# Patient Record
Sex: Male | Born: 1955 | Race: White | Hispanic: No | Marital: Married | Smoking: Former smoker
Health system: Southern US, Community
[De-identification: ages and names within clinical notes are randomized; demographics above are authoritative.]

## PROBLEM LIST (undated history)

## (undated) DIAGNOSIS — I10 Essential (primary) hypertension: Secondary | ICD-10-CM

## (undated) DIAGNOSIS — F32A Depression, unspecified: Secondary | ICD-10-CM

## (undated) DIAGNOSIS — I471 Supraventricular tachycardia, unspecified: Secondary | ICD-10-CM

## (undated) DIAGNOSIS — E119 Type 2 diabetes mellitus without complications: Secondary | ICD-10-CM

## (undated) HISTORY — DX: Supraventricular tachycardia: I47.1

## (undated) HISTORY — DX: Supraventricular tachycardia, unspecified: I47.10

---

## 2019-08-29 DIAGNOSIS — F329 Major depressive disorder, single episode, unspecified: Secondary | ICD-10-CM | POA: Diagnosis not present

## 2019-08-29 DIAGNOSIS — E785 Hyperlipidemia, unspecified: Secondary | ICD-10-CM | POA: Diagnosis not present

## 2019-08-29 DIAGNOSIS — I10 Essential (primary) hypertension: Secondary | ICD-10-CM | POA: Diagnosis not present

## 2019-08-29 DIAGNOSIS — E1165 Type 2 diabetes mellitus with hyperglycemia: Secondary | ICD-10-CM | POA: Diagnosis not present

## 2019-08-29 DIAGNOSIS — Z23 Encounter for immunization: Secondary | ICD-10-CM | POA: Diagnosis not present

## 2019-10-05 DIAGNOSIS — Z6828 Body mass index (BMI) 28.0-28.9, adult: Secondary | ICD-10-CM | POA: Diagnosis not present

## 2019-10-05 DIAGNOSIS — N390 Urinary tract infection, site not specified: Secondary | ICD-10-CM | POA: Diagnosis not present

## 2019-12-26 DIAGNOSIS — I10 Essential (primary) hypertension: Secondary | ICD-10-CM | POA: Diagnosis not present

## 2019-12-26 DIAGNOSIS — R350 Frequency of micturition: Secondary | ICD-10-CM | POA: Diagnosis not present

## 2019-12-26 DIAGNOSIS — E1165 Type 2 diabetes mellitus with hyperglycemia: Secondary | ICD-10-CM | POA: Diagnosis not present

## 2019-12-26 DIAGNOSIS — E785 Hyperlipidemia, unspecified: Secondary | ICD-10-CM | POA: Diagnosis not present

## 2020-01-13 DIAGNOSIS — R350 Frequency of micturition: Secondary | ICD-10-CM | POA: Diagnosis not present

## 2020-02-06 DIAGNOSIS — Z87440 Personal history of urinary (tract) infections: Secondary | ICD-10-CM | POA: Diagnosis not present

## 2020-02-06 DIAGNOSIS — R338 Other retention of urine: Secondary | ICD-10-CM | POA: Diagnosis not present

## 2020-02-06 DIAGNOSIS — R339 Retention of urine, unspecified: Secondary | ICD-10-CM | POA: Diagnosis not present

## 2020-02-06 DIAGNOSIS — N529 Male erectile dysfunction, unspecified: Secondary | ICD-10-CM | POA: Diagnosis not present

## 2020-02-06 DIAGNOSIS — Z87448 Personal history of other diseases of urinary system: Secondary | ICD-10-CM | POA: Diagnosis not present

## 2020-02-06 DIAGNOSIS — N401 Enlarged prostate with lower urinary tract symptoms: Secondary | ICD-10-CM | POA: Diagnosis not present

## 2020-02-06 DIAGNOSIS — N35919 Unspecified urethral stricture, male, unspecified site: Secondary | ICD-10-CM | POA: Diagnosis not present

## 2020-02-06 DIAGNOSIS — N35812 Other urethral bulbous stricture, male: Secondary | ICD-10-CM | POA: Diagnosis not present

## 2020-02-06 DIAGNOSIS — R82998 Other abnormal findings in urine: Secondary | ICD-10-CM | POA: Diagnosis not present

## 2020-02-10 DIAGNOSIS — N35914 Unspecified anterior urethral stricture, male: Secondary | ICD-10-CM | POA: Diagnosis not present

## 2020-02-10 DIAGNOSIS — N390 Urinary tract infection, site not specified: Secondary | ICD-10-CM | POA: Diagnosis not present

## 2020-02-10 DIAGNOSIS — E119 Type 2 diabetes mellitus without complications: Secondary | ICD-10-CM | POA: Diagnosis not present

## 2020-02-21 DIAGNOSIS — E119 Type 2 diabetes mellitus without complications: Secondary | ICD-10-CM | POA: Diagnosis not present

## 2020-02-21 DIAGNOSIS — Z7189 Other specified counseling: Secondary | ICD-10-CM | POA: Diagnosis not present

## 2020-02-21 DIAGNOSIS — Z03818 Encounter for observation for suspected exposure to other biological agents ruled out: Secondary | ICD-10-CM | POA: Diagnosis not present

## 2020-02-21 DIAGNOSIS — Z20828 Contact with and (suspected) exposure to other viral communicable diseases: Secondary | ICD-10-CM | POA: Diagnosis not present

## 2020-03-01 DIAGNOSIS — Z03818 Encounter for observation for suspected exposure to other biological agents ruled out: Secondary | ICD-10-CM | POA: Diagnosis not present

## 2020-03-05 DIAGNOSIS — N35919 Unspecified urethral stricture, male, unspecified site: Secondary | ICD-10-CM | POA: Diagnosis not present

## 2020-03-05 DIAGNOSIS — N401 Enlarged prostate with lower urinary tract symptoms: Secondary | ICD-10-CM | POA: Diagnosis not present

## 2020-03-05 DIAGNOSIS — E119 Type 2 diabetes mellitus without complications: Secondary | ICD-10-CM | POA: Diagnosis not present

## 2020-03-05 DIAGNOSIS — N35914 Unspecified anterior urethral stricture, male: Secondary | ICD-10-CM | POA: Diagnosis not present

## 2020-03-05 DIAGNOSIS — J45909 Unspecified asthma, uncomplicated: Secondary | ICD-10-CM | POA: Diagnosis not present

## 2020-03-05 DIAGNOSIS — Z87440 Personal history of urinary (tract) infections: Secondary | ICD-10-CM | POA: Diagnosis not present

## 2020-03-05 DIAGNOSIS — Z794 Long term (current) use of insulin: Secondary | ICD-10-CM | POA: Diagnosis not present

## 2020-03-05 DIAGNOSIS — F329 Major depressive disorder, single episode, unspecified: Secondary | ICD-10-CM | POA: Diagnosis not present

## 2020-03-05 DIAGNOSIS — I10 Essential (primary) hypertension: Secondary | ICD-10-CM | POA: Diagnosis not present

## 2020-03-05 DIAGNOSIS — R338 Other retention of urine: Secondary | ICD-10-CM | POA: Diagnosis not present

## 2020-03-05 DIAGNOSIS — E785 Hyperlipidemia, unspecified: Secondary | ICD-10-CM | POA: Diagnosis not present

## 2020-03-05 DIAGNOSIS — N35912 Unspecified bulbous urethral stricture, male: Secondary | ICD-10-CM | POA: Diagnosis not present

## 2020-03-15 DIAGNOSIS — I10 Essential (primary) hypertension: Secondary | ICD-10-CM | POA: Diagnosis not present

## 2020-03-15 DIAGNOSIS — Z6829 Body mass index (BMI) 29.0-29.9, adult: Secondary | ICD-10-CM | POA: Diagnosis not present

## 2020-03-15 DIAGNOSIS — E1165 Type 2 diabetes mellitus with hyperglycemia: Secondary | ICD-10-CM | POA: Diagnosis not present

## 2020-03-15 DIAGNOSIS — Z1331 Encounter for screening for depression: Secondary | ICD-10-CM | POA: Diagnosis not present

## 2020-04-12 DIAGNOSIS — N35914 Unspecified anterior urethral stricture, male: Secondary | ICD-10-CM | POA: Diagnosis not present

## 2020-04-12 DIAGNOSIS — Z466 Encounter for fitting and adjustment of urinary device: Secondary | ICD-10-CM | POA: Diagnosis not present

## 2020-04-17 DIAGNOSIS — E1165 Type 2 diabetes mellitus with hyperglycemia: Secondary | ICD-10-CM | POA: Diagnosis not present

## 2020-04-17 DIAGNOSIS — E7800 Pure hypercholesterolemia, unspecified: Secondary | ICD-10-CM | POA: Diagnosis not present

## 2020-04-17 DIAGNOSIS — I10 Essential (primary) hypertension: Secondary | ICD-10-CM | POA: Diagnosis not present

## 2020-04-17 DIAGNOSIS — N35919 Unspecified urethral stricture, male, unspecified site: Secondary | ICD-10-CM | POA: Diagnosis not present

## 2020-05-09 DIAGNOSIS — N35914 Unspecified anterior urethral stricture, male: Secondary | ICD-10-CM | POA: Diagnosis not present

## 2020-05-09 DIAGNOSIS — Z466 Encounter for fitting and adjustment of urinary device: Secondary | ICD-10-CM | POA: Diagnosis not present

## 2020-06-07 DIAGNOSIS — Z87440 Personal history of urinary (tract) infections: Secondary | ICD-10-CM | POA: Diagnosis not present

## 2020-06-07 DIAGNOSIS — N528 Other male erectile dysfunction: Secondary | ICD-10-CM | POA: Diagnosis not present

## 2020-06-07 DIAGNOSIS — E0821 Diabetes mellitus due to underlying condition with diabetic nephropathy: Secondary | ICD-10-CM | POA: Diagnosis not present

## 2020-06-07 DIAGNOSIS — N35914 Unspecified anterior urethral stricture, male: Secondary | ICD-10-CM | POA: Diagnosis not present

## 2020-06-20 DIAGNOSIS — I10 Essential (primary) hypertension: Secondary | ICD-10-CM | POA: Diagnosis not present

## 2020-06-20 DIAGNOSIS — F329 Major depressive disorder, single episode, unspecified: Secondary | ICD-10-CM | POA: Diagnosis not present

## 2020-06-20 DIAGNOSIS — E7800 Pure hypercholesterolemia, unspecified: Secondary | ICD-10-CM | POA: Diagnosis not present

## 2020-06-20 DIAGNOSIS — E1165 Type 2 diabetes mellitus with hyperglycemia: Secondary | ICD-10-CM | POA: Diagnosis not present

## 2020-07-11 DIAGNOSIS — N35914 Unspecified anterior urethral stricture, male: Secondary | ICD-10-CM | POA: Diagnosis not present

## 2020-07-18 DIAGNOSIS — Z20822 Contact with and (suspected) exposure to covid-19: Secondary | ICD-10-CM | POA: Diagnosis not present

## 2020-08-06 DIAGNOSIS — Z466 Encounter for fitting and adjustment of urinary device: Secondary | ICD-10-CM | POA: Diagnosis not present

## 2020-08-06 DIAGNOSIS — N35914 Unspecified anterior urethral stricture, male: Secondary | ICD-10-CM | POA: Diagnosis not present

## 2020-08-17 DIAGNOSIS — E1165 Type 2 diabetes mellitus with hyperglycemia: Secondary | ICD-10-CM | POA: Diagnosis not present

## 2020-08-17 DIAGNOSIS — Z20822 Contact with and (suspected) exposure to covid-19: Secondary | ICD-10-CM | POA: Diagnosis not present

## 2020-08-21 DIAGNOSIS — E1165 Type 2 diabetes mellitus with hyperglycemia: Secondary | ICD-10-CM | POA: Diagnosis not present

## 2020-08-21 DIAGNOSIS — N35919 Unspecified urethral stricture, male, unspecified site: Secondary | ICD-10-CM | POA: Diagnosis not present

## 2020-08-21 DIAGNOSIS — F329 Major depressive disorder, single episode, unspecified: Secondary | ICD-10-CM | POA: Diagnosis not present

## 2020-08-21 DIAGNOSIS — Z23 Encounter for immunization: Secondary | ICD-10-CM | POA: Diagnosis not present

## 2020-08-21 DIAGNOSIS — I10 Essential (primary) hypertension: Secondary | ICD-10-CM | POA: Diagnosis not present

## 2020-09-25 DIAGNOSIS — N35914 Unspecified anterior urethral stricture, male: Secondary | ICD-10-CM | POA: Diagnosis not present

## 2020-09-25 DIAGNOSIS — Z466 Encounter for fitting and adjustment of urinary device: Secondary | ICD-10-CM | POA: Diagnosis not present

## 2020-10-04 DIAGNOSIS — N35914 Unspecified anterior urethral stricture, male: Secondary | ICD-10-CM | POA: Diagnosis not present

## 2020-10-04 DIAGNOSIS — N521 Erectile dysfunction due to diseases classified elsewhere: Secondary | ICD-10-CM | POA: Diagnosis not present

## 2020-10-04 DIAGNOSIS — E0821 Diabetes mellitus due to underlying condition with diabetic nephropathy: Secondary | ICD-10-CM | POA: Diagnosis not present

## 2020-10-04 DIAGNOSIS — Z87440 Personal history of urinary (tract) infections: Secondary | ICD-10-CM | POA: Diagnosis not present

## 2020-10-22 DIAGNOSIS — Z466 Encounter for fitting and adjustment of urinary device: Secondary | ICD-10-CM | POA: Diagnosis not present

## 2020-10-23 DIAGNOSIS — I10 Essential (primary) hypertension: Secondary | ICD-10-CM | POA: Diagnosis not present

## 2020-10-23 DIAGNOSIS — Z6830 Body mass index (BMI) 30.0-30.9, adult: Secondary | ICD-10-CM | POA: Diagnosis not present

## 2020-10-23 DIAGNOSIS — E1165 Type 2 diabetes mellitus with hyperglycemia: Secondary | ICD-10-CM | POA: Diagnosis not present

## 2020-10-23 DIAGNOSIS — E7800 Pure hypercholesterolemia, unspecified: Secondary | ICD-10-CM | POA: Diagnosis not present

## 2020-11-28 DIAGNOSIS — Z87440 Personal history of urinary (tract) infections: Secondary | ICD-10-CM | POA: Diagnosis not present

## 2020-11-28 DIAGNOSIS — R82998 Other abnormal findings in urine: Secondary | ICD-10-CM | POA: Diagnosis not present

## 2020-11-28 DIAGNOSIS — N35914 Unspecified anterior urethral stricture, male: Secondary | ICD-10-CM | POA: Diagnosis not present

## 2020-11-28 DIAGNOSIS — Z466 Encounter for fitting and adjustment of urinary device: Secondary | ICD-10-CM | POA: Diagnosis not present

## 2020-11-28 DIAGNOSIS — B961 Klebsiella pneumoniae [K. pneumoniae] as the cause of diseases classified elsewhere: Secondary | ICD-10-CM | POA: Diagnosis not present

## 2020-12-24 DIAGNOSIS — N35919 Unspecified urethral stricture, male, unspecified site: Secondary | ICD-10-CM | POA: Diagnosis not present

## 2020-12-24 DIAGNOSIS — I10 Essential (primary) hypertension: Secondary | ICD-10-CM | POA: Diagnosis not present

## 2020-12-24 DIAGNOSIS — E7800 Pure hypercholesterolemia, unspecified: Secondary | ICD-10-CM | POA: Diagnosis not present

## 2020-12-24 DIAGNOSIS — E1165 Type 2 diabetes mellitus with hyperglycemia: Secondary | ICD-10-CM | POA: Diagnosis not present

## 2022-01-09 DIAGNOSIS — N35914 Unspecified anterior urethral stricture, male: Secondary | ICD-10-CM | POA: Diagnosis not present

## 2022-01-09 DIAGNOSIS — N529 Male erectile dysfunction, unspecified: Secondary | ICD-10-CM | POA: Diagnosis not present

## 2022-01-09 DIAGNOSIS — N521 Erectile dysfunction due to diseases classified elsewhere: Secondary | ICD-10-CM | POA: Diagnosis not present

## 2022-02-06 DIAGNOSIS — E119 Type 2 diabetes mellitus without complications: Secondary | ICD-10-CM | POA: Diagnosis not present

## 2022-02-06 DIAGNOSIS — I10 Essential (primary) hypertension: Secondary | ICD-10-CM | POA: Diagnosis not present

## 2022-02-06 DIAGNOSIS — E7800 Pure hypercholesterolemia, unspecified: Secondary | ICD-10-CM | POA: Diagnosis not present

## 2022-02-06 DIAGNOSIS — E538 Deficiency of other specified B group vitamins: Secondary | ICD-10-CM | POA: Diagnosis not present

## 2022-02-06 DIAGNOSIS — R413 Other amnesia: Secondary | ICD-10-CM | POA: Diagnosis not present

## 2022-02-06 DIAGNOSIS — Z0100 Encounter for examination of eyes and vision without abnormal findings: Secondary | ICD-10-CM | POA: Diagnosis not present

## 2022-02-06 DIAGNOSIS — Z6831 Body mass index (BMI) 31.0-31.9, adult: Secondary | ICD-10-CM | POA: Diagnosis not present

## 2022-02-06 DIAGNOSIS — R5383 Other fatigue: Secondary | ICD-10-CM | POA: Diagnosis not present

## 2022-02-17 DIAGNOSIS — N528 Other male erectile dysfunction: Secondary | ICD-10-CM | POA: Diagnosis not present

## 2022-04-01 DIAGNOSIS — Z794 Long term (current) use of insulin: Secondary | ICD-10-CM | POA: Diagnosis not present

## 2022-04-01 DIAGNOSIS — Z7984 Long term (current) use of oral hypoglycemic drugs: Secondary | ICD-10-CM | POA: Diagnosis not present

## 2022-04-01 DIAGNOSIS — R0602 Shortness of breath: Secondary | ICD-10-CM | POA: Diagnosis not present

## 2022-04-01 DIAGNOSIS — Z7982 Long term (current) use of aspirin: Secondary | ICD-10-CM | POA: Diagnosis not present

## 2022-04-01 DIAGNOSIS — I10 Essential (primary) hypertension: Secondary | ICD-10-CM | POA: Diagnosis not present

## 2022-04-01 DIAGNOSIS — E119 Type 2 diabetes mellitus without complications: Secondary | ICD-10-CM | POA: Diagnosis not present

## 2022-04-01 DIAGNOSIS — R079 Chest pain, unspecified: Secondary | ICD-10-CM | POA: Diagnosis not present

## 2022-04-01 DIAGNOSIS — E785 Hyperlipidemia, unspecified: Secondary | ICD-10-CM | POA: Diagnosis not present

## 2022-04-01 DIAGNOSIS — I208 Other forms of angina pectoris: Secondary | ICD-10-CM | POA: Diagnosis not present

## 2022-04-01 DIAGNOSIS — F32A Depression, unspecified: Secondary | ICD-10-CM | POA: Diagnosis not present

## 2022-04-01 DIAGNOSIS — R69 Illness, unspecified: Secondary | ICD-10-CM | POA: Diagnosis not present

## 2022-04-01 DIAGNOSIS — Z79899 Other long term (current) drug therapy: Secondary | ICD-10-CM | POA: Diagnosis not present

## 2022-04-02 DIAGNOSIS — E119 Type 2 diabetes mellitus without complications: Secondary | ICD-10-CM | POA: Diagnosis not present

## 2022-04-02 DIAGNOSIS — R079 Chest pain, unspecified: Secondary | ICD-10-CM

## 2022-04-02 DIAGNOSIS — R0600 Dyspnea, unspecified: Secondary | ICD-10-CM | POA: Diagnosis not present

## 2022-04-02 DIAGNOSIS — I208 Other forms of angina pectoris: Secondary | ICD-10-CM | POA: Diagnosis not present

## 2022-04-02 DIAGNOSIS — I10 Essential (primary) hypertension: Secondary | ICD-10-CM | POA: Diagnosis not present

## 2022-04-07 DIAGNOSIS — R0609 Other forms of dyspnea: Secondary | ICD-10-CM | POA: Diagnosis not present

## 2022-04-07 DIAGNOSIS — I2510 Atherosclerotic heart disease of native coronary artery without angina pectoris: Secondary | ICD-10-CM | POA: Diagnosis not present

## 2022-04-07 DIAGNOSIS — I2584 Coronary atherosclerosis due to calcified coronary lesion: Secondary | ICD-10-CM | POA: Diagnosis not present

## 2022-04-24 ENCOUNTER — Encounter (HOSPITAL_COMMUNITY): Payer: Self-pay | Admitting: Emergency Medicine

## 2022-04-24 ENCOUNTER — Other Ambulatory Visit: Payer: Self-pay

## 2022-04-24 ENCOUNTER — Emergency Department (HOSPITAL_COMMUNITY)
Admission: EM | Admit: 2022-04-24 | Discharge: 2022-04-25 | Disposition: A | Discharge status: Home / Self Care | Payer: 59 | Attending: Emergency Medicine | Admitting: Emergency Medicine

## 2022-04-24 ENCOUNTER — Emergency Department (HOSPITAL_COMMUNITY): Payer: 59

## 2022-04-24 DIAGNOSIS — R42 Dizziness and giddiness: Secondary | ICD-10-CM | POA: Insufficient documentation

## 2022-04-24 DIAGNOSIS — R0789 Other chest pain: Secondary | ICD-10-CM | POA: Diagnosis not present

## 2022-04-24 DIAGNOSIS — R000 Tachycardia, unspecified: Secondary | ICD-10-CM | POA: Insufficient documentation

## 2022-04-24 DIAGNOSIS — I10 Essential (primary) hypertension: Secondary | ICD-10-CM | POA: Diagnosis not present

## 2022-04-24 DIAGNOSIS — R079 Chest pain, unspecified: Secondary | ICD-10-CM | POA: Diagnosis not present

## 2022-04-24 DIAGNOSIS — E119 Type 2 diabetes mellitus without complications: Secondary | ICD-10-CM | POA: Diagnosis not present

## 2022-04-24 DIAGNOSIS — R778 Other specified abnormalities of plasma proteins: Secondary | ICD-10-CM

## 2022-04-24 DIAGNOSIS — R0602 Shortness of breath: Secondary | ICD-10-CM | POA: Diagnosis not present

## 2022-04-24 DIAGNOSIS — I471 Supraventricular tachycardia: Secondary | ICD-10-CM

## 2022-04-24 DIAGNOSIS — Z743 Need for continuous supervision: Secondary | ICD-10-CM | POA: Diagnosis not present

## 2022-04-24 HISTORY — DX: Depression, unspecified: F32.A

## 2022-04-24 HISTORY — DX: Type 2 diabetes mellitus without complications: E11.9

## 2022-04-24 HISTORY — DX: Essential (primary) hypertension: I10

## 2022-04-24 LAB — CBC WITH DIFFERENTIAL/PLATELET
Abs Immature Granulocytes: 0.07 10*3/uL (ref 0.00–0.07)
Basophils Absolute: 0.1 10*3/uL (ref 0.0–0.1)
Basophils Relative: 1 %
Eosinophils Absolute: 0.2 10*3/uL (ref 0.0–0.5)
Eosinophils Relative: 2 %
HCT: 43.2 % (ref 39.0–52.0)
Hemoglobin: 14.5 g/dL (ref 13.0–17.0)
Immature Granulocytes: 1 %
Lymphocytes Relative: 11 %
Lymphs Abs: 1.4 10*3/uL (ref 0.7–4.0)
MCH: 28.8 pg (ref 26.0–34.0)
MCHC: 33.6 g/dL (ref 30.0–36.0)
MCV: 85.9 fL (ref 80.0–100.0)
Monocytes Absolute: 1 10*3/uL (ref 0.1–1.0)
Monocytes Relative: 8 %
Neutro Abs: 9.7 10*3/uL — ABNORMAL HIGH (ref 1.7–7.7)
Neutrophils Relative %: 77 %
Platelets: 272 10*3/uL (ref 150–400)
RBC: 5.03 MIL/uL (ref 4.22–5.81)
RDW: 13.3 % (ref 11.5–15.5)
WBC: 12.5 10*3/uL — ABNORMAL HIGH (ref 4.0–10.5)
nRBC: 0 % (ref 0.0–0.2)

## 2022-04-24 LAB — BASIC METABOLIC PANEL
Anion gap: 9 (ref 5–15)
BUN: 29 mg/dL — ABNORMAL HIGH (ref 8–23)
CO2: 20 mmol/L — ABNORMAL LOW (ref 22–32)
Calcium: 8.8 mg/dL — ABNORMAL LOW (ref 8.9–10.3)
Chloride: 108 mmol/L (ref 98–111)
Creatinine, Ser: 1.92 mg/dL — ABNORMAL HIGH (ref 0.61–1.24)
GFR, Estimated: 38 mL/min — ABNORMAL LOW (ref >60)
Glucose, Bld: 151 mg/dL — ABNORMAL HIGH (ref 70–99)
Potassium: 4.3 mmol/L (ref 3.5–5.1)
Sodium: 137 mmol/L (ref 135–145)

## 2022-04-24 LAB — TROPONIN I (HIGH SENSITIVITY)
Troponin I (High Sensitivity): 13 ng/L (ref <18)
Troponin I (High Sensitivity): 25 ng/L — ABNORMAL HIGH (ref <18)

## 2022-04-24 LAB — TSH: TSH: 1.321 u[IU]/mL (ref 0.350–4.500)

## 2022-04-24 NOTE — ED Provider Notes (Signed)
  [facility] EMERGENCY DEPARTMENT Provider Note   CSN: [id number] Arrival date & time: 04/24/22  1957     History {Add pertinent medical, surgical, social history, OB history to HPI:1} Chief Complaint  Patient presents with   Chest Pain    Peter Thornton is a 66 y.o. male.   Chest Pain     Home Medications Prior to Admission medications   Not on File      Allergies    Patient has no known allergies.    Review of Systems   Review of Systems  Cardiovascular:  Positive for chest pain.   Physical Exam Updated Vital Signs BP 123/85   Pulse 80   Temp 97.7 F (36.5 C) (Oral)   Resp (!) 32   SpO2 97%  Physical Exam  ED Results / Procedures / Treatments   Labs (all labs ordered are listed, but only abnormal results are displayed) Labs Reviewed  CBC WITH DIFFERENTIAL/PLATELET - Abnormal; Notable for the following components:      Result Value   WBC 12.5 (*)    Neutro Abs 9.7 (*)    All other components within normal limits  BASIC METABOLIC PANEL - Abnormal; Notable for the following components:   CO2 20 (*)    Glucose, Bld 151 (*)    BUN 29 (*)    Creatinine, Ser 1.92 (*)    Calcium 8.8 (*)    GFR, Estimated 38 (*)    All other components within normal limits  TROPONIN I (HIGH SENSITIVITY) - Abnormal; Notable for the following components:   Troponin I (High Sensitivity) 25 (*)    All other components within normal limits  TSH  TROPONIN I (HIGH SENSITIVITY)    EKG EKG Interpretation  Date/Time:  Thursday April 24 2022 19:59:38 EDT Ventricular Rate:  115 PR Interval:  178 QRS Duration: 92 QT Interval:  309 QTC Calculation: 428 R Axis:   -14 Text Interpretation: Sinus tachycardia No previous tracing Confirmed by Gwyneth Sprout ([id number]) on 04/24/2022 8:26:10 PM  Radiology DG Chest Port 1 View  Result Date: 04/24/2022 CLINICAL DATA:  Chest pain and shortness of breath. EXAM: PORTABLE CHEST 1 VIEW COMPARISON:  Apr 01, 2022  FINDINGS: The heart size and mediastinal contours are within normal limits. Mildly decreased lung volumes are seen. Both lungs are clear. The visualized skeletal structures are unremarkable. IMPRESSION: No active disease. Electronically Signed   By: Aram Candela M.D.   On: 04/24/2022 20:29    Procedures Procedures  {Document cardiac monitor, telemetry assessment procedure when appropriate:1}  Medications Ordered in ED Medications - No data to display  ED Course/ Medical Decision Making/ A&P                           Medical Decision Making Amount and/or Complexity of Data Reviewed Labs: ordered. Radiology: ordered.   ***  {Document critical care time when appropriate:1} {Document review of labs and clinical decision tools ie heart score, Chads2Vasc2 etc:1}  {Document your independent review of radiology images, and any outside records:1} {Document your discussion with family members, caretakers, and with consultants:1} {Document social determinants of health affecting pt's care:1} {Document your decision making why or why not admission, treatments were needed:1} Final Clinical Impression(s) / ED Diagnoses Final diagnoses:  None    Rx / DC Orders ED Discharge Orders     None

## 2022-04-24 NOTE — ED Triage Notes (Signed)
Pt here from work via [employer] for chest pressure. At 1500 today he developed chest tightness, has had it intermittently x1 month. Pt was seen at [facility] and was told he had a viral infection affecting his heart, pt hasn't had symptoms x2 weeks until today. While at work pt became dizzy, felt like he was going to pass out. Initial HR 170s, pt did vagal maneuvers and decreased HR to 120s. Now pain is 2/10 pressure. EMS gave NS, 18g AC. 124/76, 120HR, 97% RA, CBG 190

## 2022-04-25 LAB — TROPONIN I (HIGH SENSITIVITY): Troponin I (High Sensitivity): 28 ng/L — ABNORMAL HIGH (ref <18)

## 2022-04-25 NOTE — Discharge Instructions (Signed)
You were seen today and noted to have a fast heart rate.  You likely were in an arrhythmia called SVT.  If this happens again try vagal maneuvers.  You should follow-up very closely with cardiology as your cardiac enzymes were slightly elevated.  If you develop any recurrence of symptoms, you should be reevaluated immediately.

## 2022-04-25 NOTE — ED Provider Notes (Addendum)
Patient signed out repeat troponin.  In brief presented with chest discomfort and near syncope.  Noted to have a heart rate in the 170s.  Reportedly did vagal maneuvers and heart rate settled into sinus tachycardia.  Symptoms had improved on evaluation.  Initial troponin 12.  12:31 AM Repeat troponin 25.  Patient remains symptom-free.  I have actually been able to review EMS strips at the bedside which did indicate likely SVT.  Patient indicates that he had a normal stress test within the last month outside hospital.  Slight bump in troponin may be related to episode of tachycardia.  Patient would really like to go home.  Given his age and risk factors, recommended that we repeat troponin 1 more time.  If downtrending, I feel it is reasonable to attribute this to his episode of tachycardia.  If continues to uptrend, will likely need admission.  2:33 AM Repeat troponin 28.  Trend is leveling out.  I highly suspect that his troponin trending is a result of his tachycardia.  He remains asymptomatic.  No recurrent episodes of SVT.  I offered admission given his risk factors and troponin values.  I had a risk-benefit discussion with the patient and his wife.  I discussed with him that I could not fully rule out ACS as the cause.  However, his recent reported negative stress testing is reassuring but not definitive.  Patient associates his symptoms directly with this episode of SVT.  He would like to follow-up with cardiology as an outpatient.  He and his wife understand the risk and benefits.  He has agreed to call EMS immediately if he has recurrence of symptoms.  I have placed an ambulatory referral to cardiology.  Physical Exam  BP 123/85   Pulse 80   Temp 97.7 F (36.5 C) (Oral)   Resp (!) 32   SpO2 97%   Physical Exam Awake, alert, no acute distress   Procedures  Procedures  ED Course / MDM     Medical Decision Making Amount and/or Complexity of Data Reviewed Labs: ordered. Radiology:  ordered.   Problem List Items Addressed This Visit   None Visit Diagnoses     SVT (supraventricular tachycardia) ([place])    -  Primary   Relevant Orders   Ambulatory referral to Cardiology   Elevated troponin       Relevant Orders   Ambulatory referral to Cardiology             Merryl Hacker, MD 04/25/22 [id number]    Merryl Hacker, MD 04/25/22 [id number]

## 2022-05-13 NOTE — Progress Notes (Deleted)
Cardiology Office Note:    Date:  05/13/2022   ID:  Peter Thornton, DOB 21-Mar-1956, MRN [id number]  PCP:  Lonie Peak, PA-C   [facility] Providers Cardiologist:  None   Referring MD: Shon Baton, MD    History of Present Illness:    Peter Thornton is a 66 y.o. male with a hx of DMII, HTN, SVT and depression who was referred by Ross Marcus, MD for further management of SVT.  Patient seen in [facility] ER on 04/25/22 where he presented with chest discomfort and near syncope found to be in SVT with HR 170s. Note reviewed. Did vagal manuevers with resolution. Trop 13>25>28. Patient declined admission and now presents to clinic for follow-up.  Today, ***  Past Medical History:  Diagnosis Date   Depression    Diabetes mellitus without complication (HCC)    Hypertension    SVT (supraventricular tachycardia) (HCC)     No past surgical history on file.  Current Medications: No outpatient medications have been marked as taking for the 05/15/22 encounter (Appointment) with Meriam Sprague, MD.     Allergies:   Patient has no known allergies.   Social History   Socioeconomic History   Marital status: Married    Spouse name: Not on file   Number of children: Not on file   Years of education: Not on file   Highest education level: Not on file  Occupational History   Not on file  Tobacco Use   Smoking status: Not on file   Smokeless tobacco: Not on file  Substance and Sexual Activity   Alcohol use: Not on file   Drug use: Not on file   Sexual activity: Not on file  Other Topics Concern   Not on file  Social History Narrative   Not on file   Social Determinants of Health   Financial Resource Strain: Not on file  Food Insecurity: Not on file  Transportation Needs: Not on file  Physical Activity: Not on file  Stress: Not on file  Social Connections: Not on file     Family History: The patient's ***family history is not on  file.  ROS:   Please see the history of present illness.    *** All other systems reviewed and are negative.  EKGs/Labs/Other Studies Reviewed:    The following studies were reviewed today: ***  EKG:  EKG is *** ordered today.  The ekg ordered today demonstrates ***  Recent Labs: 04/24/2022: BUN 29; Creatinine, Ser 1.92; Hemoglobin 14.5; Platelets 272; Potassium 4.3; Sodium 137; TSH 1.321  Recent Lipid Panel No results found for: "CHOL", "TRIG", "HDL", "CHOLHDL", "VLDL", "LDLCALC", "LDLDIRECT"   Risk Assessment/Calculations:   {Does this patient have ATRIAL FIBRILLATION?:[phone]}       Physical Exam:    VS:  There were no vitals taken for this visit.    Wt Readings from Last 3 Encounters:  No data found for Wt     GEN: *** Well nourished, well developed in no acute distress HEENT: Normal NECK: No JVD; No carotid bruits LYMPHATICS: No lymphadenopathy CARDIAC: ***RRR, no murmurs, rubs, gallops RESPIRATORY:  Clear to auscultation without rales, wheezing or rhonchi  ABDOMEN: Soft, non-tender, non-distended MUSCULOSKELETAL:  No edema; No deformity  SKIN: Warm and dry NEUROLOGIC:  Alert and oriented x 3 PSYCHIATRIC:  Normal affect   ASSESSMENT:    No diagnosis found. PLAN:    In order of problems listed above:  #SVT: Patient with recent ER visit for  palpitations found to be in SVT with HR 170s. Improved with vagal maneuvers. Declined admission at that time. Currently, *** -Check 14 day zio -Start metop 25mg  BID -Check TTE -TSH normal      {Are you ordering a CV Procedure (e.g. stress test, cath, DCCV, TEE, etc)?   Press F2        :    Medication Adjustments/Labs and Tests Ordered: Current medicines are reviewed at length with the patient today.  Concerns regarding medicines are outlined above.  No orders of the defined types were placed in this encounter.  No orders of the defined types were placed in this encounter.   There are no Patient  Instructions on file for this visit.   Signed, [id number]}, MD  05/13/2022 7:49 PM    [facility]

## 2022-05-15 ENCOUNTER — Ambulatory Visit: Payer: 59 | Admitting: Cardiology

## 2022-05-15 ENCOUNTER — Ambulatory Visit: Payer: 59

## 2022-05-15 ENCOUNTER — Encounter: Payer: Self-pay | Admitting: Cardiology

## 2022-05-15 VITALS — BP 122/76 | HR 91 | Ht 73.0 in | Wt 232.0 lb

## 2022-05-15 DIAGNOSIS — R002 Palpitations: Secondary | ICD-10-CM

## 2022-05-15 DIAGNOSIS — I471 Supraventricular tachycardia: Secondary | ICD-10-CM | POA: Diagnosis not present

## 2022-05-15 DIAGNOSIS — I10 Essential (primary) hypertension: Secondary | ICD-10-CM | POA: Diagnosis not present

## 2022-05-15 DIAGNOSIS — R7989 Other specified abnormal findings of blood chemistry: Secondary | ICD-10-CM

## 2022-05-15 DIAGNOSIS — E782 Mixed hyperlipidemia: Secondary | ICD-10-CM | POA: Diagnosis not present

## 2022-05-15 DIAGNOSIS — E119 Type 2 diabetes mellitus without complications: Secondary | ICD-10-CM

## 2022-05-15 MED ORDER — METOPROLOL SUCCINATE ER 25 MG PO TB24: 25.0000 mg | ORAL_TABLET | Freq: Every day | ORAL | 2 refills | Status: DC

## 2022-05-15 MED ORDER — METOPROLOL TARTRATE 25 MG PO TABS: 25.0000 mg | ORAL_TABLET | Freq: Two times a day (BID) | ORAL | 3 refills | Status: DC | PRN

## 2022-05-15 NOTE — Progress Notes (Unsigned)
Enrolled for [employer] to mail a ZIO XT long term holter monitor to the patients address on file.  

## 2022-05-15 NOTE — Patient Instructions (Signed)
Medication Instructions:   START TAKING METOPROLOL SUCCINATE (TOPROL XL) 25 MG BY MOUTH DAILY AT BEDTIME  START TAKING METOPROLOL TARTRATE (LOPRESSOR) 25 MG BY MOUTH TWICE DAILY AS NEEDED FOR PALPITATIONS  *If you need a refill on your cardiac medications before your next appointment, please call your pharmacy*   Lab Work:  TODAY--BMET  If you have labs (blood work) drawn today and your tests are completely normal, you will receive your results only by: MyChart Message (if you have MyChart) OR A paper copy in the mail If you have any lab test that is abnormal or we need to change your treatment, we will call you to review the results.   Testing/Procedures:  Your physician has requested that you have an echocardiogram. Echocardiography is a painless test that uses sound waves to create images of your heart. It provides your doctor with information about the size and shape of your heart and how well your heart's chambers and valves are working. This procedure takes approximately one hour. There are no restrictions for this procedure.   ZIO XT- Long Term Monitor Instructions  Your physician has requested you wear a ZIO patch monitor for 14 days.  This is a single patch monitor. [employer] supplies one patch monitor per enrollment. Additional stickers are not available. Please do not apply patch if you will be having a Nuclear Stress Test,  Echocardiogram, Cardiac CT, MRI, or Chest Xray during the period you would be wearing the  monitor. The patch cannot be worn during these tests. You cannot remove and re-apply the  ZIO XT patch monitor.  Your ZIO patch monitor will be mailed 3 day USPS to your address on file. It may take 3-5 days  to receive your monitor after you have been enrolled.  Once you have received your monitor, please review the enclosed instructions. Your monitor  has already been registered assigning a specific monitor serial # to you.  Billing and Patient Assistance  Program Information  We have supplied [employer] with any of your insurance information on file for billing purposes. [employer] offers a sliding scale Patient Assistance Program for patients that do not have  insurance, or whose insurance does not completely cover the cost of the ZIO monitor.  You must apply for the Patient Assistance Program to qualify for this discounted rate.  To apply, please call [employer] at [phone], select option 4, select option 2, ask to apply for  Patient Assistance Program. Meredeth Ide will ask your household income, and how many people  are in your household. They will quote your out-of-pocket cost based on that information.  [employer] will also be able to set up a 59-month, interest-free payment plan if needed.  Applying the monitor   Shave hair from upper left chest.  Hold abrader disc by orange tab. Rub abrader in 40 strokes over the upper left chest as  indicated in your monitor instructions.  Clean area with 4 enclosed alcohol pads. Let dry.  Apply patch as indicated in monitor instructions. Patch will be placed under collarbone on left  side of chest with arrow pointing upward.  Rub patch adhesive wings for 2 minutes. Remove white label marked "1". Remove the white  label marked "2". Rub patch adhesive wings for 2 additional minutes.  While looking in a mirror, press and release button in center of patch. A small green light will  flash 3-4 times. This will be your only indicator that the monitor has been turned on.  Do not shower  for the first 24 hours. You may shower after the first 24 hours.  Press the button if you feel a symptom. You will hear a small click. Record Date, Time and  Symptom in the Patient Logbook.  When you are ready to remove the patch, follow instructions on the last 2 pages of Patient  Logbook. Stick patch monitor onto the last page of Patient Logbook.  Place Patient Logbook in the blue and white box. Use locking tab on box and tape box  closed  securely. The blue and white box has prepaid postage on it. Please place it in the mailbox as  soon as possible. Your physician should have your test results approximately 7 days after the  monitor has been mailed back to [facility].  Call [facility] Customer Care at [phone] if you have questions regarding  your ZIO XT patch monitor. Call them immediately if you see an orange light blinking on your  monitor.  If your monitor falls off in less than 4 days, contact our Monitor department at [phone].  If your monitor becomes loose or falls off after 4 days call [employer] at [phone] for  suggestions on securing your monitor    Follow-Up: At [facility], you and your health needs are our priority.  As part of our continuing mission to provide you with exceptional heart care, we have created designated Provider Care Teams.  These Care Teams include your primary Cardiologist (physician) and Advanced Practice Providers (APPs -  Physician Assistants and Nurse Practitioners) who all work together to provide you with the care you need, when you need it.  We recommend signing up for the patient portal called "MyChart".  Sign up information is provided on this After Visit Summary.  MyChart is used to connect with patients for Virtual Visits (Telemedicine).  Patients are able to view lab/test results, encounter notes, upcoming appointments, etc.  Non-urgent messages can be sent to your provider as well.   To learn more about what you can do with MyChart, go to [email or web address].    Your next appointment:   6 month(s)  The format for your next appointment:   In Person  Provider:   DR. Shari Prows    Important Information About Sugar

## 2022-05-15 NOTE — Progress Notes (Signed)
Cardiology Office Note:    Date:  05/15/2022   ID:  Peter Thornton, DOB [birth year], MRN [id number]  PCP:  Lonie Peak, PA-C   [facility] Providers Cardiologist:  None   Referring MD: Shon Baton, MD    History of Present Illness:    Peter Thornton is a 66 y.o. male with a hx of DMII, HTN, SVT and depression who was referred by Ross Marcus, MD for further management of SVT and elevated troponin.  Patient seen in [facility] ER on 04/25/22 where he presented with chest discomfort and near syncope found to be in SVT with HR 170s. Note reviewed. Did vagal manuevers with resolution. Trop 13>25>28. Patient declined admission and now presents to clinic for follow-up.  Today, he is accompanied with his wife, and overall says he has been doing okay, but has had SVTs three times over the past 6 weeks. Each episode has lasted about an hour-1.5 hours before abating. Vagal maneuvers have helped but did not completely break the rhythm. When he flips into SVT, he becomes very symptomatic with chest tightness, SOB, fatigue, lightheadedness. Had a near syncope event prior to ER visit but no syncope.   Overall, he is otherwise active without exertional symptoms. BP well controlled. No orthopnea, PND, LE edema, exertional chest pain.    Past Medical History:  Diagnosis Date   Depression    Diabetes mellitus without complication (HCC)    Hypertension    SVT (supraventricular tachycardia) (HCC)     No past surgical history on file.  Current Medications: Current Meds  Medication Sig   atorvastatin (LIPITOR) 40 MG tablet Take 40 mg by mouth daily.   lisinopril (ZESTRIL) 40 MG tablet Take 40 mg by mouth daily.   metFORMIN (GLUCOPHAGE) 1000 MG tablet Take 1,000 mg by mouth 2 (two) times daily.   metoprolol succinate (TOPROL XL) 25 MG 24 hr tablet Take 1 tablet (25 mg total) by mouth at bedtime.   metoprolol tartrate (LOPRESSOR) 25 MG tablet Take 1 tablet (25 mg total) by  mouth 2 (two) times daily as needed (for palpitations).   NOVOLIN R FLEXPEN RELION 100 UNIT/ML FlexPen Inject 40 Units into the skin at bedtime.   venlafaxine (EFFEXOR) 75 MG tablet Take 75 mg by mouth daily.     Allergies:   Patient has no known allergies.   Social History   Socioeconomic History   Marital status: Married    Spouse name: Not on file   Number of children: Not on file   Years of education: Not on file   Highest education level: Not on file  Occupational History   Not on file  Tobacco Use   Smoking status: Former    Types: Cigarettes   Smokeless tobacco: Not on file  Substance and Sexual Activity   Alcohol use: Not on file   Drug use: Not on file   Sexual activity: Not on file  Other Topics Concern   Not on file  Social History Narrative   Not on file   Social Determinants of Health   Financial Resource Strain: Not on file  Food Insecurity: Not on file  Transportation Needs: Not on file  Physical Activity: Not on file  Stress: Not on file  Social Connections: Not on file     Family History: The patient's family history is not on file.  ROS:   Please see the history of present illness.    (+) Shortness of Breath (+) Near Syncope All other  systems reviewed and are negative.  EKGs/Labs/Other Studies Reviewed:    The following studies were reviewed today: No prior cardiovascular studies available.   EKG:  EKG is personally reviewed.  04/24/22: Sinus tach  Recent Labs: 04/24/2022: BUN 29; Creatinine, Ser 1.92; Hemoglobin 14.5; Platelets 272; Potassium 4.3; Sodium 137; TSH 1.321  Recent Lipid Panel No results found for: "CHOL", "TRIG", "HDL", "CHOLHDL", "VLDL", "LDLCALC", "LDLDIRECT"   Risk Assessment/Calculations:           Physical Exam:    VS:  BP 122/76   Pulse 91   Ht 6\' 1"  (1.854 m)   Wt 232 lb (105.2 kg)   SpO2 96%   BMI 30.61 kg/m     Wt Readings from Last 3 Encounters:  05/15/22 232 lb (105.2 kg)     GEN:  Well  nourished, well developed in no acute distress HEENT: Normal NECK: No JVD; No carotid bruits CARDIAC: RRR, no murmurs, rubs, gallops RESPIRATORY:  Clear to auscultation without rales, wheezing or rhonchi  ABDOMEN: Soft, non-tender, non-distended MUSCULOSKELETAL:  No edema; No deformity  SKIN: Warm and dry NEUROLOGIC:  Alert and oriented x 3 PSYCHIATRIC:  Normal affect   ASSESSMENT:    1. SVT (supraventricular tachycardia) (HCC)   2. Palpitations   3. Elevated serum creatinine   4. Essential hypertension   5. Diabetes mellitus with coincident hypertension (HCC)   6. Mixed hyperlipidemia    PLAN:    In order of problems listed above:  #SVT: Patient with recent ER visit for palpitations found to be in SVT with HR 170s. Improved with vagal maneuvers. Declined admission at that time. Currently, continuing to have intermittent episodes lasting 1-1.5hours at a time. Very symptomatic at the time of SVT with chest tightness, lightheadedness, SOB. Discussed option of medical therapy +/- ablation. He would like to trial medication and only proceed with ablation if meds fail. -Check 14 day zio -Start metop 25mg  XL qHS -Start metop 25mg  BID prn for breakthrough SVT -Check TTE -TSH normal -If medication fails, patient amenable to see EP for evaluation  #HTN: Well controlled. -Continue lisinopril 40mg  daily -Start metop 25mg  XL daily as above  #DMII on Insulin: -Continue home insulin -Management per PCP  #HLD: -Continue lipitor 40mg  daily -Declined Ca score -Follow-up with PCP  #AKI: Cr elevated in ER in the setting of SVT. Will repeat for monitoring. -Check BMET         F/U in 6 months   Medication Adjustments/Labs and Tests Ordered: Current medicines are reviewed at length with the patient today.  Concerns regarding medicines are outlined above.  Orders Placed This Encounter  Procedures   Basic metabolic panel   LONG TERM MONITOR (3-14 DAYS)   ECHOCARDIOGRAM COMPLETE    Meds ordered this encounter  Medications   metoprolol succinate (TOPROL XL) 25 MG 24 hr tablet    Sig: Take 1 tablet (25 mg total) by mouth at bedtime.    Dispense:  90 tablet    Refill:  2   metoprolol tartrate (LOPRESSOR) 25 MG tablet    Sig: Take 1 tablet (25 mg total) by mouth 2 (two) times daily as needed (for palpitations).    Dispense:  60 tablet    Refill:  3    Patient Instructions  Medication Instructions:   START TAKING METOPROLOL SUCCINATE (TOPROL XL) 25 MG BY MOUTH DAILY AT BEDTIME  START TAKING METOPROLOL TARTRATE (LOPRESSOR) 25 MG BY MOUTH TWICE DAILY AS NEEDED FOR PALPITATIONS  *If you need a  refill on your cardiac medications before your next appointment, please call your pharmacy*   Lab Work:  TODAY--BMET  If you have labs (blood work) drawn today and your tests are completely normal, you will receive your results only by: MyChart Message (if you have MyChart) OR A paper copy in the mail If you have any lab test that is abnormal or we need to change your treatment, we will call you to review the results.   Testing/Procedures:  Your physician has requested that you have an echocardiogram. Echocardiography is a painless test that uses sound waves to create images of your heart. It provides your doctor with information about the size and shape of your heart and how well your heart's chambers and valves are working. This procedure takes approximately one hour. There are no restrictions for this procedure.   ZIO XT- Long Term Monitor Instructions  Your physician has requested you wear a ZIO patch monitor for 14 days.  This is a single patch monitor. [employer] supplies one patch monitor per enrollment. Additional stickers are not available. Please do not apply patch if you will be having a Nuclear Stress Test,  Echocardiogram, Cardiac CT, MRI, or Chest Xray during the period you would be wearing the  monitor. The patch cannot be worn during these tests. You  cannot remove and re-apply the  ZIO XT patch monitor.  Your ZIO patch monitor will be mailed 3 day USPS to your address on file. It may take 3-5 days  to receive your monitor after you have been enrolled.  Once you have received your monitor, please review the enclosed instructions. Your monitor  has already been registered assigning a specific monitor serial # to you.  Billing and Patient Assistance Program Information  We have supplied [employer] with any of your insurance information on file for billing purposes. [employer] offers a sliding scale Patient Assistance Program for patients that do not have  insurance, or whose insurance does not completely cover the cost of the ZIO monitor.  You must apply for the Patient Assistance Program to qualify for this discounted rate.  To apply, please call [employer] at [phone], select option 4, select option 2, ask to apply for  Patient Assistance Program. Meredeth Ide will ask your household income, and how many people  are in your household. They will quote your out-of-pocket cost based on that information.  [employer] will also be able to set up a 54-month, interest-free payment plan if needed.  Applying the monitor   Shave hair from upper left chest.  Hold abrader disc by orange tab. Rub abrader in 40 strokes over the upper left chest as  indicated in your monitor instructions.  Clean area with 4 enclosed alcohol pads. Let dry.  Apply patch as indicated in monitor instructions. Patch will be placed under collarbone on left  side of chest with arrow pointing upward.  Rub patch adhesive wings for 2 minutes. Remove white label marked "1". Remove the white  label marked "2". Rub patch adhesive wings for 2 additional minutes.  While looking in a mirror, press and release button in center of patch. A small green light will  flash 3-4 times. This will be your only indicator that the monitor has been turned on.  Do not shower for the first 24 hours. You may  shower after the first 24 hours.  Press the button if you feel a symptom. You will hear a small click. Record Date, Time and  Symptom in the Patient  Logbook.  When you are ready to remove the patch, follow instructions on the last 2 pages of Patient  Logbook. Stick patch monitor onto the last page of Patient Logbook.  Place Patient Logbook in the blue and white box. Use locking tab on box and tape box closed  securely. The blue and white box has prepaid postage on it. Please place it in the mailbox as  soon as possible. Your physician should have your test results approximately 7 days after the  monitor has been mailed back to [facility].  Call [facility] at [phone] if you have questions regarding  your ZIO XT patch monitor. Call them immediately if you see an orange light blinking on your  monitor.  If your monitor falls off in less than 4 days, contact our Monitor department at [phone].  If your monitor becomes loose or falls off after 4 days call [employer] at [phone] for  suggestions on securing your monitor    Follow-Up: At [facility], you and your health needs are our priority.  As part of our continuing mission to provide you with exceptional heart care, we have created designated Provider Care Teams.  These Care Teams include your primary Cardiologist (physician) and Advanced Practice Providers (APPs -  Physician Assistants and Nurse Practitioners) who all work together to provide you with the care you need, when you need it.  We recommend signing up for the patient portal called "MyChart".  Sign up information is provided on this After Visit Summary.  MyChart is used to connect with patients for Virtual Visits (Telemedicine).  Patients are able to view lab/test results, encounter notes, upcoming appointments, etc.  Non-urgent messages can be sent to your provider as well.   To learn more about what you can do with MyChart, go to  [email or web address].    Your next appointment:   6 month(s)  The format for your next appointment:   In Person  Provider:   DR. Johney Frame    Important Information About Sugar          I,Tinashe Williams,acting as a scribe for Freada Bergeron, MD.,have documented all relevant documentation on the behalf of Freada Bergeron, MD,as directed by  Freada Bergeron, MD while in the presence of Freada Bergeron, MD.   I, Freada Bergeron, MD, have reviewed all documentation for this visit. The documentation on 05/15/22 for the exam, diagnosis, procedures, and orders are all accurate and complete.

## 2022-05-16 LAB — BASIC METABOLIC PANEL
BUN/Creatinine Ratio: 20 (ref 10–24)
BUN: 25 mg/dL (ref 8–27)
CO2: 23 mmol/L (ref 20–29)
Calcium: 9.5 mg/dL (ref 8.6–10.2)
Chloride: 98 mmol/L (ref 96–106)
Creatinine, Ser: 1.26 mg/dL (ref 0.76–1.27)
Glucose: 356 mg/dL — ABNORMAL HIGH (ref 70–99)
Potassium: 5.2 mmol/L (ref 3.5–5.2)
Sodium: 135 mmol/L (ref 134–144)
eGFR: 64 mL/min/{1.73_m2} (ref >59)

## 2022-05-23 ENCOUNTER — Ambulatory Visit (HOSPITAL_COMMUNITY): Payer: 59 | Attending: Cardiology

## 2022-05-23 DIAGNOSIS — R002 Palpitations: Secondary | ICD-10-CM | POA: Diagnosis not present

## 2022-05-23 DIAGNOSIS — I471 Supraventricular tachycardia: Secondary | ICD-10-CM | POA: Insufficient documentation

## 2022-05-23 LAB — ECHOCARDIOGRAM COMPLETE
Area-P 1/2: 4.49 cm2
S' Lateral: 2.5 cm

## 2022-05-26 DIAGNOSIS — I2510 Atherosclerotic heart disease of native coronary artery without angina pectoris: Secondary | ICD-10-CM | POA: Diagnosis not present

## 2022-05-26 DIAGNOSIS — E119 Type 2 diabetes mellitus without complications: Secondary | ICD-10-CM | POA: Diagnosis not present

## 2022-05-26 DIAGNOSIS — R69 Illness, unspecified: Secondary | ICD-10-CM | POA: Diagnosis not present

## 2022-05-26 DIAGNOSIS — E7800 Pure hypercholesterolemia, unspecified: Secondary | ICD-10-CM | POA: Diagnosis not present

## 2022-05-26 DIAGNOSIS — I2584 Coronary atherosclerosis due to calcified coronary lesion: Secondary | ICD-10-CM | POA: Diagnosis not present

## 2022-05-26 DIAGNOSIS — I471 Supraventricular tachycardia: Secondary | ICD-10-CM | POA: Diagnosis not present

## 2022-05-26 DIAGNOSIS — I10 Essential (primary) hypertension: Secondary | ICD-10-CM | POA: Diagnosis not present

## 2022-08-12 DIAGNOSIS — Z23 Encounter for immunization: Secondary | ICD-10-CM | POA: Diagnosis not present

## 2022-08-12 DIAGNOSIS — R059 Cough, unspecified: Secondary | ICD-10-CM | POA: Diagnosis not present

## 2022-09-04 ENCOUNTER — Other Ambulatory Visit: Payer: Self-pay

## 2022-09-04 DIAGNOSIS — R002 Palpitations: Secondary | ICD-10-CM

## 2022-09-04 DIAGNOSIS — I471 Supraventricular tachycardia, unspecified: Secondary | ICD-10-CM

## 2022-09-04 MED ORDER — METOPROLOL TARTRATE 25 MG PO TABS: 25.0000 mg | ORAL_TABLET | Freq: Two times a day (BID) | ORAL | 2 refills | Status: DC | PRN

## 2022-09-12 ENCOUNTER — Other Ambulatory Visit: Payer: Self-pay

## 2022-10-29 NOTE — Progress Notes (Deleted)
Cardiology Office Note:    Date:  10/29/2022   ID:  Peter Thornton, DOB [birth year], MRN [id number]  PCP:  Lonie Peak, PA-C   [facility] Providers Cardiologist:  None   Referring MD: Lonie Peak, PA-C    History of Present Illness:    Peter Thornton is a 66 y.o. male with a hx of DMII, HTN, SVT and depression who was referred by Ross Marcus, MD for further management of SVT and elevated troponin.  Patient seen in [facility] ER on 04/25/22 where he presented with chest discomfort and near syncope found to be in SVT with HR 170s. Note reviewed. Did vagal manuevers with resolution. Trop 13>25>28. Patient declined admission.   Was seen in follow-up 2022/05/03 where he was doing better. Was having more frequent SVT episodes. We started metop 25mg  XL daily with prn metop tartrate 25mg  for breakthrough. TTE 03-May-2022 showed LVEF 60-65%, normal GLS, normal RV, no significant valve disease. Cardiac monitor never completed.  Today, ***   Past Medical History:  Diagnosis Date   Depression    Diabetes mellitus without complication (HCC)    Hypertension    SVT (supraventricular tachycardia) (HCC)     No past surgical history on file.  Current Medications: No outpatient medications have been marked as taking for the 10/31/22 encounter (Appointment) with 05/2022, MD.     Allergies:   Patient has no known allergies.   Social History   Socioeconomic History   Marital status: Married    Spouse name: Not on file   Number of children: Not on file   Years of education: Not on file   Highest education level: Not on file  Occupational History   Not on file  Tobacco Use   Smoking status: Former    Types: Cigarettes   Smokeless tobacco: Not on file  Substance and Sexual Activity   Alcohol use: Not on file   Drug use: Not on file   Sexual activity: Not on file  Other Topics Concern   Not on file  Social History Narrative   Not on file   Social  Determinants of Health   Financial Resource Strain: Not on file  Food Insecurity: Not on file  Transportation Needs: Not on file  Physical Activity: Not on file  Stress: Not on file  Social Connections: Not on file     Family History: The patient's family history is not on file.  ROS:   Please see the history of present illness.    (+) Shortness of Breath (+) Near Syncope All other systems reviewed and are negative.  EKGs/Labs/Other Studies Reviewed:    The following studies were reviewed today: TTE May 03, 2022: IMPRESSIONS     1. Left ventricular ejection fraction, by estimation, is 60 to 65%. The  left ventricle has normal function. The left ventricle has no regional  wall motion abnormalities. There is moderate concentric left ventricular  hypertrophy. Left ventricular  diastolic parameters are indeterminate. The average left ventricular  global longitudinal strain is -17.0 %. The global longitudinal strain is  normal.   2. Right ventricular systolic function is normal. The right ventricular  size is normal. Tricuspid regurgitation signal is inadequate for assessing  PA pressure.   3. The mitral valve is normal in structure. Trivial mitral valve  regurgitation. No evidence of mitral stenosis.   4. The aortic valve is normal in structure. Aortic valve regurgitation is  not visualized. No aortic stenosis is present.   5. The  inferior vena cava is normal in size with greater than 50%  respiratory variability, suggesting right atrial pressure of 3 mmHg.   EKG:  EKG is personally reviewed.  04/24/22: Sinus tach  Recent Labs: 04/24/2022: Hemoglobin 14.5; Platelets 272; TSH 1.321 05/15/2022: BUN 25; Creatinine, Ser 1.26; Potassium 5.2; Sodium 135  Recent Lipid Panel No results found for: "CHOL", "TRIG", "HDL", "CHOLHDL", "VLDL", "LDLCALC", "LDLDIRECT"   Risk Assessment/Calculations:           Physical Exam:    VS:  There were no vitals taken for this visit.    Wt  Readings from Last 3 Encounters:  05/15/22 232 lb (105.2 kg)     GEN:  Well nourished, well developed in no acute distress HEENT: Normal NECK: No JVD; No carotid bruits CARDIAC: RRR, no murmurs, rubs, gallops RESPIRATORY:  Clear to auscultation without rales, wheezing or rhonchi  ABDOMEN: Soft, non-tender, non-distended MUSCULOSKELETAL:  No edema; No deformity  SKIN: Warm and dry NEUROLOGIC:  Alert and oriented x 3 PSYCHIATRIC:  Normal affect   ASSESSMENT:    No diagnosis found.  PLAN:    In order of problems listed above:  #SVT: Overall improved with initiation of metop. TTE with normal BiV function and no significant valve disease. He would like to continue with medical management and only proceed with ablation if meds fail. -Continue metop 25mg  XL qHS -Continue metop 25mg  BID prn for breakthrough SVT -If medication fails, patient amenable to see EP for evaluation  #HTN: Well controlled. -Continue lisinopril 40mg  daily -Continue metop 25mg  XL daily as above  #DMII on Insulin: -Continue home insulin -Continue metformin 1000mg  BID -Management per PCP  #HLD: -Continue lipitor 40mg  daily -Declined Ca score -Follow-up with PCP          F/U in 6 months   Medication Adjustments/Labs and Tests Ordered: Current medicines are reviewed at length with the patient today.  Concerns regarding medicines are outlined above.  No orders of the defined types were placed in this encounter.  No orders of the defined types were placed in this encounter.   There are no Patient Instructions on file for this visit.

## 2022-10-31 ENCOUNTER — Encounter: Payer: Self-pay | Admitting: Cardiology

## 2022-10-31 ENCOUNTER — Ambulatory Visit: Payer: PPO | Attending: Cardiology | Admitting: Cardiology

## 2022-10-31 VITALS — BP 124/78 | HR 84 | Ht 73.0 in | Wt 234.0 lb

## 2022-10-31 DIAGNOSIS — Z79899 Other long term (current) drug therapy: Secondary | ICD-10-CM

## 2022-10-31 DIAGNOSIS — E119 Type 2 diabetes mellitus without complications: Secondary | ICD-10-CM

## 2022-10-31 DIAGNOSIS — I10 Essential (primary) hypertension: Secondary | ICD-10-CM | POA: Diagnosis not present

## 2022-10-31 DIAGNOSIS — I471 Supraventricular tachycardia, unspecified: Secondary | ICD-10-CM

## 2022-10-31 DIAGNOSIS — R002 Palpitations: Secondary | ICD-10-CM | POA: Diagnosis not present

## 2022-10-31 DIAGNOSIS — E782 Mixed hyperlipidemia: Secondary | ICD-10-CM

## 2022-10-31 NOTE — Progress Notes (Signed)
Cardiology Office Note:    Date:  10/31/2022   ID:  Peter Thornton, DOB 02/14/1956, MRN [id number]  PCP:  Cyndi Bender, PA-C   [facility] Providers Cardiologist:  None   Referring MD: Cyndi Bender, PA-C    History of Present Illness:    Peter Thornton is a 66 y.o. male with a hx of DMII, HTN, SVT and depression who was referred by Thayer Jew, MD for further management of SVT and elevated troponin.  Patient seen in [facility] ER on 04/25/22 where he presented with chest discomfort and near syncope found to be in SVT with HR 170s. Note reviewed. Did vagal manuevers with resolution. Trop 13>25>28. Patient declined admission.   Was seen in follow-up 04/2022 where he was doing better. Was having more frequent SVT episodes. We started metop 25mg  XL daily with prn metop tartrate 25mg  for breakthrough. TTE 04/2022 showed LVEF 60-65%, normal GLS, normal RV, no significant valve disease. Cardiac monitor never completed.  Today, the patient notes that he is doing well. Had only rare palpitations since our last visit but states he had two episodes this week. One episode sounds more like orthostatic symptoms as he got dizzy after standing quickly and recovered quickly. The second episode he felt his heart race which improved with his metoprolol.  Otherwise, he is doing very well. Discussed option of ablation vs continued medical management and he prefers to continue with medical management.  He denies any chest pain, shortness of breath, or peripheral edema. No headaches, syncope, orthopnea, or PND.  For his DM, he is compliant with Insulin and metformin 2x day.  For exercise, he regularly walks. He is staying hydrated. He drinks 1-2 beers once in a while.   Past Medical History:  Diagnosis Date   Depression    Diabetes mellitus without complication (HCC)    Hypertension    SVT (supraventricular tachycardia)     History reviewed. No pertinent surgical  history.  Current Medications: Current Meds  Medication Sig   atorvastatin (LIPITOR) 40 MG tablet Take 40 mg by mouth daily.   lisinopril (ZESTRIL) 40 MG tablet Take 40 mg by mouth daily.   metFORMIN (GLUCOPHAGE) 1000 MG tablet Take 1,000 mg by mouth 2 (two) times daily.   metoprolol succinate (TOPROL XL) 25 MG 24 hr tablet Take 1 tablet (25 mg total) by mouth at bedtime.   metoprolol tartrate (LOPRESSOR) 25 MG tablet Take 1 tablet (25 mg total) by mouth 2 (two) times daily as needed (for palpitations).   NOVOLIN R FLEXPEN RELION 100 UNIT/ML FlexPen Inject 40 Units into the skin at bedtime.   venlafaxine (EFFEXOR) 75 MG tablet Take 75 mg by mouth daily.     Allergies:   Patient has no known allergies.   Social History   Socioeconomic History   Marital status: Married    Spouse name: Not on file   Number of children: Not on file   Years of education: Not on file   Highest education level: Not on file  Occupational History   Not on file  Tobacco Use   Smoking status: Former    Types: Cigarettes   Smokeless tobacco: Not on file  Substance and Sexual Activity   Alcohol use: Not on file   Drug use: Not on file   Sexual activity: Not on file  Other Topics Concern   Not on file  Social History Narrative   Not on file   Social Determinants of Health   Financial Resource  Strain: Not on file  Food Insecurity: Not on file  Transportation Needs: Not on file  Physical Activity: Not on file  Stress: Not on file  Social Connections: Not on file     Family History: The patient's family history is not on file.  ROS:   Please see the history of present illness.    Review of Systems  Constitutional:  Negative for chills and fever.  HENT:  Positive for ear pain. Negative for nosebleeds and tinnitus.   Eyes:  Negative for blurred vision and pain.  Respiratory:  Negative for cough, hemoptysis, shortness of breath and stridor.   Cardiovascular:  Positive for palpitations. Negative  for chest pain, orthopnea, claudication, leg swelling and PND.  Gastrointestinal:  Negative for blood in stool, diarrhea, nausea and vomiting.  Genitourinary:  Negative for dysuria and hematuria.  Musculoskeletal:  Negative for falls.  Neurological:  Positive for dizziness. Negative for loss of consciousness and headaches.  Psychiatric/Behavioral:  Negative for depression, hallucinations and substance abuse. The patient does not have insomnia.    All other systems reviewed and are negative.  EKGs/Labs/Other Studies Reviewed:    The following studies were reviewed today:  TTE 04/2022: IMPRESSIONS     1. Left ventricular ejection fraction, by estimation, is 60 to 65%. The  left ventricle has normal function. The left ventricle has no regional  wall motion abnormalities. There is moderate concentric left ventricular  hypertrophy. Left ventricular  diastolic parameters are indeterminate. The average left ventricular  global longitudinal strain is -17.0 %. The global longitudinal strain is  normal.   2. Right ventricular systolic function is normal. The right ventricular  size is normal. Tricuspid regurgitation signal is inadequate for assessing  PA pressure.   3. The mitral valve is normal in structure. Trivial mitral valve  regurgitation. No evidence of mitral stenosis.   4. The aortic valve is normal in structure. Aortic valve regurgitation is  not visualized. No aortic stenosis is present.   5. The inferior vena cava is normal in size with greater than 50%  respiratory variability, suggesting right atrial pressure of 3 mmHg.   Long term monitor 05/15/22: (supraventricular tachycardia) (HCC) [I47.1 (ICD-10-CM)], Palpitations [R00.2 (ICD-10-CM)]    EKG:  EKG is personally reviewed. 10/31/22: EKG was not ordered. 04/24/22: Sinus tachycardia  Recent Labs: 04/24/2022: Hemoglobin 14.5; Platelets 272; TSH 1.321 05/15/2022: BUN 25; Creatinine, Ser 1.26; Potassium 5.2; Sodium 135    Recent Lipid Panel No results found for: "CHOL", "TRIG", "HDL", "CHOLHDL", "VLDL", "LDLCALC", "LDLDIRECT"   Risk Assessment/Calculations:           Physical Exam:    VS:  BP 124/78   Pulse 84   Ht 6\' 1"  (1.854 m)   Wt 234 lb (106.1 kg)   SpO2 96%   BMI 30.87 kg/m     Wt Readings from Last 3 Encounters:  10/31/22 234 lb (106.1 kg)  05/15/22 232 lb (105.2 kg)     GEN:  Comfortable, NAD HEENT: Normal NECK: No JVD; No carotid bruits CARDIAC: RRR, no murmurs, rubs, gallops RESPIRATORY:  Clear to auscultation without rales, wheezing or rhonchi  ABDOMEN: Soft, non-tender, non-distended MUSCULOSKELETAL:  No edema; No deformity  SKIN: Warm and dry NEUROLOGIC:  Alert and oriented x 3 PSYCHIATRIC:  Normal affect   ASSESSMENT:    1. SVT (supraventricular tachycardia)   2. Mixed hyperlipidemia   3. Diabetes mellitus with coincident hypertension ([place])   4. Essential hypertension   5. Medication management   6.  Palpitations     PLAN:    In order of problems listed above:  #SVT: Overall improved with initiation of metop. TTE with normal BiV function and no significant valve disease. He would like to continue with medical management and only proceed with ablation if meds fail. -Continue metop 25mg  XL qHS; can uptitrate if needed -Continue metop 25mg  BID prn for breakthrough SVT -If medication fails, patient amenable to see EP for evaluation  #HTN: Well controlled and at goal. -Continue lisinopril 40mg  daily -Continue metop 25mg  XL daily as above  #DMII on Insulin: -Continue home insulin -Continue metformin 1000mg  BID -Check A1C today -Management per PCP  #HLD: -Continue lipitor 40mg  daily -Declined Ca score -Check lipids today          Follow-up: in 6 months  Medication Adjustments/Labs and Tests Ordered: Current medicines are reviewed at length with the patient today.  Concerns regarding medicines are outlined above.  Orders Placed This Encounter   Procedures   Lipid Profile   HgB A1c   No orders of the defined types were placed in this encounter.   Patient Instructions  Medication Instructions:   Your physician recommends that you continue on your current medications as directed. Please refer to the Current Medication list given to you today.  *If you need a refill on your cardiac medications before your next appointment, please call your pharmacy*   Lab Work:  TODAY--LIPIDS AND A1C  If you have labs (blood work) drawn today and your tests are completely normal, you will receive your results only by: MyChart Message (if you have MyChart) OR A paper copy in the mail If you have any lab test that is abnormal or we need to change your treatment, we will call you to review the results.    Follow-Up: At [facility], you and your health needs are our priority.  As part of our continuing mission to provide you with exceptional heart care, we have created designated Provider Care Teams.  These Care Teams include your primary Cardiologist (physician) and Advanced Practice Providers (APPs -  Physician Assistants and Nurse Practitioners) who all work together to provide you with the care you need, when you need it.  We recommend signing up for the patient portal called "MyChart".  Sign up information is provided on this After Visit Summary.  MyChart is used to connect with patients for Virtual Visits (Telemedicine).  Patients are able to view lab/test results, encounter notes, upcoming appointments, etc.  Non-urgent messages can be sent to your provider as well.   To learn more about what you can do with MyChart, go to .    Your next appointment:   6 month(s)  The format for your next appointment:   In Person  Provider:   DR.  Important Information About Sugar          I,Mitra Faeizi,acting as a scribe for , MD.,have documented all relevant documentation on  the behalf of , MD,as directed by  , MD while in the presence of [facility], MD.  I, [email or web address], MD, have reviewed all documentation for this visit. The documentation on 10/31/22 for the exam, diagnosis, procedures, and orders are all accurate and complete.

## 2022-10-31 NOTE — Patient Instructions (Signed)
Medication Instructions:   Your physician recommends that you continue on your current medications as directed. Please refer to the Current Medication list given to you today.  *If you need a refill on your cardiac medications before your next appointment, please call your pharmacy*   Lab Work:  TODAY--LIPIDS AND A1C  If you have labs (blood work) drawn today and your tests are completely normal, you will receive your results only by: MyChart Message (if you have MyChart) OR A paper copy in the mail If you have any lab test that is abnormal or we need to change your treatment, we will call you to review the results.    Follow-Up: At [facility], you and your health needs are our priority.  As part of our continuing mission to provide you with exceptional heart care, we have created designated Provider Care Teams.  These Care Teams include your primary Cardiologist (physician) and Advanced Practice Providers (APPs -  Physician Assistants and Nurse Practitioners) who all work together to provide you with the care you need, when you need it.  We recommend signing up for the patient portal called "MyChart".  Sign up information is provided on this After Visit Summary.  MyChart is used to connect with patients for Virtual Visits (Telemedicine).  Patients are able to view lab/test results, encounter notes, upcoming appointments, etc.  Non-urgent messages can be sent to your provider as well.   To learn more about what you can do with MyChart, go to [email or web address].    Your next appointment:   6 month(s)  The format for your next appointment:   In Person  Provider:   DR. Shari Prows  Important Information About Sugar

## 2022-11-03 LAB — LIPID PANEL
Chol/HDL Ratio: 5.5 ratio — ABNORMAL HIGH (ref 0.0–5.0)
Cholesterol, Total: 191 mg/dL (ref 100–199)
HDL: 35 mg/dL — ABNORMAL LOW (ref >39)
LDL Chol Calc (NIH): 135 mg/dL — ABNORMAL HIGH (ref 0–99)
Triglycerides: 115 mg/dL (ref 0–149)
VLDL Cholesterol Cal: 21 mg/dL (ref 5–40)

## 2022-11-03 LAB — HEMOGLOBIN A1C
Est. average glucose Bld gHb Est-mCnc: 252 mg/dL
Hgb A1c MFr Bld: 10.4 % — ABNORMAL HIGH (ref 4.8–5.6)

## 2022-11-05 ENCOUNTER — Telehealth: Payer: Self-pay | Admitting: *Deleted

## 2022-11-05 DIAGNOSIS — E782 Mixed hyperlipidemia: Secondary | ICD-10-CM

## 2022-11-05 DIAGNOSIS — E119 Type 2 diabetes mellitus without complications: Secondary | ICD-10-CM

## 2022-11-05 DIAGNOSIS — E7800 Pure hypercholesterolemia, unspecified: Secondary | ICD-10-CM

## 2022-11-05 DIAGNOSIS — Z79899 Other long term (current) drug therapy: Secondary | ICD-10-CM

## 2022-11-05 MED ORDER — ATORVASTATIN CALCIUM 80 MG PO TABS: 80.0000 mg | ORAL_TABLET | Freq: Every day | ORAL | 1 refills | Status: DC

## 2022-11-05 NOTE — Telephone Encounter (Signed)
-----   Message from Meriam Sprague, MD sent at 11/04/2022  2:30 PM EST ----- His LDL cholesterol is above goal at 135. Can we increase his lipitor to 80mg  daily and repeat lipids in 8 weeks. His goal LDL<70.  His A1C is also elevated at 10.4. He needs to ensure he follows up with his primary to see if his insulin needs to be adjusted as well as continue to work on diet and cutting back significantly on sugar intake.

## 2022-11-05 NOTE — Telephone Encounter (Signed)
The patient has been notified of the result and verbalized understanding.  All questions (if any) were answered.  Pt aware that we will increase his lipitor to 80 mg po daily and recheck his lipids in 8 weeks on increased dose.   Confirmed the pharmacy of choice with the pt.   Scheduled the pt for repeat lipids in 8 weeks on 01/01/23.  Pt aware to come fasting to this lab appt.   Pt aware he needs to get in with his PCP for sometime soon, for further management of elevated A1C and to have his insulin adjusted as needed.  Advised him to work on healthy lifestyle modification and make sure he decreases sugar intake.   Pt aware I will forward a copy of this result to his PCP on file.   Pt verbalized understanding and agrees with this plan.

## 2023-01-01 ENCOUNTER — Ambulatory Visit: Payer: PPO | Attending: Cardiology

## 2023-01-01 DIAGNOSIS — E782 Mixed hyperlipidemia: Secondary | ICD-10-CM

## 2023-01-01 DIAGNOSIS — Z79899 Other long term (current) drug therapy: Secondary | ICD-10-CM | POA: Diagnosis not present

## 2023-01-01 DIAGNOSIS — E7800 Pure hypercholesterolemia, unspecified: Secondary | ICD-10-CM | POA: Diagnosis not present

## 2023-01-01 DIAGNOSIS — E119 Type 2 diabetes mellitus without complications: Secondary | ICD-10-CM

## 2023-01-01 DIAGNOSIS — I10 Essential (primary) hypertension: Secondary | ICD-10-CM | POA: Diagnosis not present

## 2023-01-01 LAB — LIPID PANEL
Chol/HDL Ratio: 4.3 ratio (ref 0.0–5.0)
Cholesterol, Total: 124 mg/dL (ref 100–199)
HDL: 29 mg/dL — ABNORMAL LOW (ref >39)
LDL Chol Calc (NIH): 74 mg/dL (ref 0–99)
Triglycerides: 111 mg/dL (ref 0–149)
VLDL Cholesterol Cal: 21 mg/dL (ref 5–40)

## 2023-01-02 ENCOUNTER — Telehealth: Payer: Self-pay | Admitting: *Deleted

## 2023-01-02 DIAGNOSIS — E7800 Pure hypercholesterolemia, unspecified: Secondary | ICD-10-CM

## 2023-01-02 DIAGNOSIS — E782 Mixed hyperlipidemia: Secondary | ICD-10-CM

## 2023-01-02 DIAGNOSIS — Z79899 Other long term (current) drug therapy: Secondary | ICD-10-CM

## 2023-01-02 MED ORDER — EZETIMIBE 10 MG PO TABS: 10.0000 mg | ORAL_TABLET | Freq: Every day | ORAL | 2 refills | Status: DC

## 2023-01-02 NOTE — Telephone Encounter (Signed)
The patient has been notified of the result and verbalized understanding.  All questions (if any) were answered.  Pt states he would like to proceed with taking zetia 10 mg po daily and come in for repeat lipids in 3 months.   Confirmed the pharmacy of choice with the pt.  Scheduled the pt for repeat lipids in 3 months on 04/03/23.  He is aware to come fasting to this lab appt.   Pt verbalized understanding and agrees with this plan.

## 2023-01-02 NOTE — Telephone Encounter (Signed)
-----   Message from Freada Bergeron, MD sent at 01/02/2023  9:00 AM EST ----- LDL cholesterol is slightly above goal at 74. Goal is <70. We can either work on lifestyle modifications or add zetia 10mg  daily to get LDL to goal. Repeat lipids in 3 months to ensure we get there.

## 2023-01-12 ENCOUNTER — Encounter: Payer: Self-pay | Admitting: Cardiology

## 2023-01-12 DIAGNOSIS — I471 Supraventricular tachycardia, unspecified: Secondary | ICD-10-CM

## 2023-01-12 DIAGNOSIS — R002 Palpitations: Secondary | ICD-10-CM

## 2023-02-11 DIAGNOSIS — J019 Acute sinusitis, unspecified: Secondary | ICD-10-CM | POA: Diagnosis not present

## 2023-02-11 DIAGNOSIS — R6889 Other general symptoms and signs: Secondary | ICD-10-CM | POA: Diagnosis not present

## 2023-02-11 DIAGNOSIS — H6591 Unspecified nonsuppurative otitis media, right ear: Secondary | ICD-10-CM | POA: Diagnosis not present

## 2023-02-25 DIAGNOSIS — F32A Depression, unspecified: Secondary | ICD-10-CM | POA: Diagnosis not present

## 2023-02-25 DIAGNOSIS — I10 Essential (primary) hypertension: Secondary | ICD-10-CM | POA: Diagnosis not present

## 2023-02-25 DIAGNOSIS — Z9181 History of falling: Secondary | ICD-10-CM | POA: Diagnosis not present

## 2023-02-25 DIAGNOSIS — Z1211 Encounter for screening for malignant neoplasm of colon: Secondary | ICD-10-CM | POA: Diagnosis not present

## 2023-02-25 DIAGNOSIS — E119 Type 2 diabetes mellitus without complications: Secondary | ICD-10-CM | POA: Diagnosis not present

## 2023-02-25 DIAGNOSIS — I471 Supraventricular tachycardia, unspecified: Secondary | ICD-10-CM | POA: Diagnosis not present

## 2023-02-25 DIAGNOSIS — Z1212 Encounter for screening for malignant neoplasm of rectum: Secondary | ICD-10-CM | POA: Diagnosis not present

## 2023-02-25 DIAGNOSIS — E7800 Pure hypercholesterolemia, unspecified: Secondary | ICD-10-CM | POA: Diagnosis not present

## 2023-04-03 ENCOUNTER — Ambulatory Visit: Payer: PPO | Attending: Cardiovascular Disease

## 2023-04-03 DIAGNOSIS — E7800 Pure hypercholesterolemia, unspecified: Secondary | ICD-10-CM

## 2023-04-03 DIAGNOSIS — E782 Mixed hyperlipidemia: Secondary | ICD-10-CM | POA: Diagnosis not present

## 2023-04-03 DIAGNOSIS — Z79899 Other long term (current) drug therapy: Secondary | ICD-10-CM | POA: Diagnosis not present

## 2023-04-03 LAB — LIPID PANEL
Chol/HDL Ratio: 3 ratio (ref 0.0–5.0)
Cholesterol, Total: 83 mg/dL — ABNORMAL LOW (ref 100–199)
HDL: 28 mg/dL — ABNORMAL LOW (ref >39)
LDL Chol Calc (NIH): 39 mg/dL (ref 0–99)
Triglycerides: 74 mg/dL (ref 0–149)
VLDL Cholesterol Cal: 16 mg/dL (ref 5–40)

## 2023-04-28 DIAGNOSIS — E7800 Pure hypercholesterolemia, unspecified: Secondary | ICD-10-CM | POA: Diagnosis not present

## 2023-04-28 DIAGNOSIS — E119 Type 2 diabetes mellitus without complications: Secondary | ICD-10-CM | POA: Diagnosis not present

## 2023-04-28 DIAGNOSIS — F32A Depression, unspecified: Secondary | ICD-10-CM | POA: Diagnosis not present

## 2023-04-28 DIAGNOSIS — I10 Essential (primary) hypertension: Secondary | ICD-10-CM | POA: Diagnosis not present

## 2023-04-28 DIAGNOSIS — I471 Supraventricular tachycardia, unspecified: Secondary | ICD-10-CM | POA: Diagnosis not present

## 2023-05-04 ENCOUNTER — Other Ambulatory Visit: Payer: Self-pay

## 2023-05-04 DIAGNOSIS — E7800 Pure hypercholesterolemia, unspecified: Secondary | ICD-10-CM

## 2023-05-04 DIAGNOSIS — E782 Mixed hyperlipidemia: Secondary | ICD-10-CM

## 2023-05-04 DIAGNOSIS — E119 Type 2 diabetes mellitus without complications: Secondary | ICD-10-CM

## 2023-05-04 DIAGNOSIS — Z79899 Other long term (current) drug therapy: Secondary | ICD-10-CM

## 2023-05-04 MED ORDER — ATORVASTATIN CALCIUM 80 MG PO TABS: 80.0000 mg | ORAL_TABLET | Freq: Every day | ORAL | 0 refills | Status: DC

## 2023-05-17 NOTE — Progress Notes (Unsigned)
Cardiology Office Note:    Date:  05/20/2023   ID:  Peter Thornton, DOB 02/09/1956, MRN [id number]  PCP:  Lonie Peak, PA-C   [facility] Providers Cardiologist:  None   Referring MD: Lonie Peak, PA-C    History of Present Illness:    Peter Thornton is a 67 y.o. male with a hx of DMII, HTN, SVT and depression who presents to clinic for follow-up.  Patient seen in [facility] ER on 04/25/22 where he presented with chest discomfort and near syncope found to be in SVT with HR 170s. Note reviewed. Did vagal manuevers with resolution. Trop 13>25>28. Patient declined admission.   Was seen in follow-up 04/2022 where he was doing better. Was having more frequent SVT episodes. We started metop 25mg  XL daily with prn metop tartrate 25mg  for breakthrough. TTE 04/2022 showed LVEF 60-65%, normal GLS, normal RV, no significant valve disease. Cardiac monitor never completed.  Was last seen in clinic on 10/2022 where he was doing well with improved palpitations.  Today, the patient overall feels well. No palpitations since starting the metoprolol XL BID. No chest pain, SOB, orthopnea or PND. No lightheadedness, dizziness or syncope. Blood pressure is well controlled. Cholesterol is very well controlled. Remains very active and walks 10,000 steps per day.   Past Medical History:  Diagnosis Date   Depression    Diabetes mellitus without complication (HCC)    Hypertension    SVT (supraventricular tachycardia)     No past surgical history on file.  Current Medications: Current Meds  Medication Sig   atorvastatin (LIPITOR) 80 MG tablet Take 1 tablet (80 mg total) by mouth daily. Patient needs to come to upcoming appointment to receive refills.   ezetimibe (ZETIA) 10 MG tablet Take 1 tablet (10 mg total) by mouth daily.   LANTUS SOLOSTAR 100 UNIT/ML Solostar Pen Inject 60 Units into the skin daily.   lisinopril (ZESTRIL) 40 MG tablet Take 40 mg by mouth daily.   metFORMIN  (GLUCOPHAGE) 1000 MG tablet Take 1,000 mg by mouth 2 (two) times daily.   metoprolol succinate (TOPROL-XL) 25 MG 24 hr tablet Take 25 mg by mouth 2 (two) times daily.   NOVOLIN R FLEXPEN RELION 100 UNIT/ML FlexPen Inject 40 Units into the skin at bedtime.   venlafaxine (EFFEXOR) 75 MG tablet Take 75 mg by mouth daily.     Allergies:   Patient has no known allergies.   Social History   Socioeconomic History   Marital status: Married    Spouse name: Not on file   Number of children: Not on file   Years of education: Not on file   Highest education level: Not on file  Occupational History   Not on file  Tobacco Use   Smoking status: Former    Types: Cigarettes   Smokeless tobacco: Not on file  Substance and Sexual Activity   Alcohol use: Not on file   Drug use: Not on file   Sexual activity: Not on file  Other Topics Concern   Not on file  Social History Narrative   Not on file   Social Determinants of Health   Financial Resource Strain: Not on file  Food Insecurity: Not on file  Transportation Needs: Not on file  Physical Activity: Not on file  Stress: Not on file  Social Connections: Not on file     Family History: The patient's family history is not on file.  ROS:   Please see the history of present  illness.    All other systems reviewed and are negative.  EKGs/Labs/Other Studies Reviewed:    The following studies were reviewed today:  Cardiac Studies & Procedures       ECHOCARDIOGRAM  ECHOCARDIOGRAM COMPLETE 05/23/2022  Narrative ECHOCARDIOGRAM REPORT    Patient Name:   Peter Thornton [facility] Date of Exam: 05/23/2022 Medical Rec #:  [id number]              Height:       73.0 in Accession #:    [id number]             Weight:       232.0 lb Date of Birth:  October 14, 1956             BSA:          2.292 m Patient Age:    65 years               BP:           122/76 mmHg Patient Gender: M                      HR:           77 bpm. Exam Location:  [address]  Procedure: 2D Echo, Cardiac Doppler, Color Doppler and Strain Analysis  Indications:    SVT (supraventricular tachycardia) (HCC) [I47.1 (ICD-10-CM)]; Palpitations [R00.2 (ICD-10-CM)]  History:        Patient has no prior history of Echocardiogram examinations.  Sonographer:    Margreta Journey RDCS Referring Phys: Kathlynn Grate Bina Veenstra  IMPRESSIONS   1. Left ventricular ejection fraction, by estimation, is 60 to 65%. The left ventricle has normal function. The left ventricle has no regional wall motion abnormalities. There is moderate concentric left ventricular hypertrophy. Left ventricular diastolic parameters are indeterminate. The average left ventricular global longitudinal strain is -17.0 %. The global longitudinal strain is normal. 2. Right ventricular systolic function is normal. The right ventricular size is normal. Tricuspid regurgitation signal is inadequate for assessing PA pressure. 3. The mitral valve is normal in structure. Trivial mitral valve regurgitation. No evidence of mitral stenosis. 4. The aortic valve is normal in structure. Aortic valve regurgitation is not visualized. No aortic stenosis is present. 5. The inferior vena cava is normal in size with greater than 50% respiratory variability, suggesting right atrial pressure of 3 mmHg.  FINDINGS Left Ventricle: Left ventricular ejection fraction, by estimation, is 60 to 65%. The left ventricle has normal function. The left ventricle has no regional wall motion abnormalities. The average left ventricular global longitudinal strain is -17.0 %. The global longitudinal strain is normal. The left ventricular internal cavity size was normal in size. There is moderate concentric left ventricular hypertrophy. Left ventricular diastolic parameters are indeterminate. Normal left ventricular filling pressure.  Right Ventricle: The right ventricular size is normal. No increase in right ventricular wall thickness. Right  ventricular systolic function is normal. Tricuspid regurgitation signal is inadequate for assessing PA pressure.  Left Atrium: Left atrial size was normal in size.  Right Atrium: Right atrial size was normal in size.  Pericardium: There is no evidence of pericardial effusion.  Mitral Valve: The mitral valve is normal in structure. Trivial mitral valve regurgitation. No evidence of mitral valve stenosis.  Tricuspid Valve: The tricuspid valve is normal in structure. Tricuspid valve regurgitation is trivial. No evidence of tricuspid stenosis.  Aortic Valve: The aortic valve is normal in structure. Aortic valve regurgitation is  not visualized. No aortic stenosis is present.  Pulmonic Valve: The pulmonic valve was normal in structure. Pulmonic valve regurgitation is not visualized. No evidence of pulmonic stenosis.  Aorta: The aortic root is normal in size and structure.  Venous: The inferior vena cava is normal in size with greater than 50% respiratory variability, suggesting right atrial pressure of 3 mmHg.  IAS/Shunts: No atrial level shunt detected by color flow Doppler.   LEFT VENTRICLE PLAX 2D LVIDd:         3.50 cm   Diastology LVIDs:         2.50 cm   LV e' medial:    6.53 cm/s LV PW:         1.50 cm   LV E/e' medial:  12.1 LV IVS:        1.60 cm   LV e' lateral:   9.05 cm/s LVOT diam:     2.70 cm   LV E/e' lateral: 8.7 LV SV:         105 LV SV Index:   46        2D Longitudinal Strain LVOT Area:     5.73 cm  2D Strain GLS (A2C):   -15.1 % 2D Strain GLS (A3C):   -17.8 % 2D Strain GLS (A4C):   -18.2 % 2D Strain GLS Avg:     -17.0 %  RIGHT VENTRICLE             IVC RV Basal diam:  2.90 cm     IVC diam: 1.90 cm RV S prime:     14.50 cm/s TAPSE (M-mode): 2.8 cm  LEFT ATRIUM             Index        RIGHT ATRIUM           Index LA diam:        4.10 cm 1.79 cm/m   RA Area:     16.20 cm LA Vol (A2C):   61.5 ml 26.84 ml/m  RA Volume:   35.80 ml  15.62 ml/m LA Vol (A4C):    49.7 ml 21.69 ml/m LA Biplane Vol: 54.7 ml 23.87 ml/m AORTIC VALVE LVOT Vmax:   87.80 cm/s LVOT Vmean:  63.300 cm/s LVOT VTI:    0.183 m  AORTA Ao Root diam: 3.40 cm Ao Asc diam:  3.30 cm Ao Desc diam: 2.20 cm  MITRAL VALVE MV Area (PHT): 4.49 cm    SHUNTS MV Decel Time: 169 msec    Systemic VTI:  0.18 m MV E velocity: 79.10 cm/s  Systemic Diam: 2.70 cm MV A velocity: 76.50 cm/s MV E/A ratio:  1.03  Armanda Magic MD Electronically signed by Armanda Magic MD Signature Date/Time: 05/23/2022/5:04:10 PM    Final               EKG:  EKG is personally reviewed. 10/31/22: EKG was not ordered. 04/24/22: Sinus tachycardia  Recent Labs: No results found for requested labs within last 365 days.   Recent Lipid Panel    Component Value Date/Time   CHOL 83 (L) 04/03/2023 0742   TRIG 74 04/03/2023 0742   HDL 28 (L) 04/03/2023 0742   CHOLHDL 3.0 04/03/2023 0742   LDLCALC 39 04/03/2023 0742     Risk Assessment/Calculations:           Physical Exam:    VS:  BP 120/66   Pulse 84   Ht 6\' 2"  (1.88 m)   Wt 230  lb (104.3 kg)   SpO2 97%   BMI 29.53 kg/m     Wt Readings from Last 3 Encounters:  05/20/23 230 lb (104.3 kg)  10/31/22 234 lb (106.1 kg)  05/15/22 232 lb (105.2 kg)     GEN:  Comfortable, NAD HEENT: Normal NECK: No JVD; No carotid bruits CARDIAC: RRR, no murmurs RESPIRATORY:  Clear to auscultation without rales, wheezing or rhonchi  ABDOMEN: Soft, non-tender, non-distended MUSCULOSKELETAL:  No edema; No deformity  SKIN: Warm and dry NEUROLOGIC:  Alert and oriented x 3 PSYCHIATRIC:  Normal affect   ASSESSMENT:    1. SVT (supraventricular tachycardia)   2. Essential hypertension   3. Diabetes mellitus with coincident hypertension (HCC)   4. Mixed hyperlipidemia   5. Palpitations     PLAN:    In order of problems listed above:  #SVT: Overall improved with initiation of metop. TTE with normal BiV function and no significant valve disease. He  would like to continue with medical management and only proceed with ablation if meds fail. -Continue metop 25mg  XL qHS; can uptitrate if needed -Continue metop 25mg  BID prn for breakthrough SVT -If medication fails, patient amenable to see EP for evaluation  #HTN: Well controlled and at goal. -Continue lisinopril 40mg  daily -Continue metop 25mg  XL daily as above  #DMII on Insulin: -Continue home insulin -Continue metformin 1000mg  BID -Management per PCP  #HLD: -Check Ca score -Continue lipitor 80mg  daily -LDL controlled at 39        Medication Adjustments/Labs and Tests Ordered: Current medicines are reviewed at length with the patient today.  Concerns regarding medicines are outlined above.  Orders Placed This Encounter  Procedures   CT CARDIAC SCORING (SELF PAY ONLY)   EKG 12-Lead   No orders of the defined types were placed in this encounter.   Patient Instructions  Medication Instructions:   Your physician recommends that you continue on your current medications as directed. Please refer to the Current Medication list given to you today.  *If you need a refill on your cardiac medications before your next appointment, please call your pharmacy*   Testing/Procedures:  CARDIAC CALCIUM SCORE (SELF PAY)   Follow-Up: At [facility], you and your health needs are our priority.  As part of our continuing mission to provide you with exceptional heart care, we have created designated Provider Care Teams.  These Care Teams include your primary Cardiologist (physician) and Advanced Practice Providers (APPs -  Physician Assistants and Nurse Practitioners) who all work together to provide you with the care you need, when you need it.  We recommend signing up for the patient portal called "MyChart".  Sign up information is provided on this After Visit Summary.  MyChart is used to connect with patients for Virtual Visits (Telemedicine).  Patients are able to view  lab/test results, encounter notes, upcoming appointments, etc.  Non-urgent messages can be sent to your provider as well.   To learn more about what you can do with MyChart, go to [email or web address].    Your next appointment:   1 year(s)  Provider:   Jodelle Red, MD

## 2023-05-20 ENCOUNTER — Encounter: Payer: Self-pay | Admitting: Cardiology

## 2023-05-20 ENCOUNTER — Ambulatory Visit: Payer: PPO | Attending: Cardiology | Admitting: Cardiology

## 2023-05-20 VITALS — BP 120/66 | HR 84 | Ht 74.0 in | Wt 230.0 lb

## 2023-05-20 DIAGNOSIS — I10 Essential (primary) hypertension: Secondary | ICD-10-CM | POA: Diagnosis not present

## 2023-05-20 DIAGNOSIS — I471 Supraventricular tachycardia, unspecified: Secondary | ICD-10-CM | POA: Diagnosis not present

## 2023-05-20 DIAGNOSIS — E782 Mixed hyperlipidemia: Secondary | ICD-10-CM | POA: Diagnosis not present

## 2023-05-20 DIAGNOSIS — Z7984 Long term (current) use of oral hypoglycemic drugs: Secondary | ICD-10-CM | POA: Diagnosis not present

## 2023-05-20 DIAGNOSIS — E119 Type 2 diabetes mellitus without complications: Secondary | ICD-10-CM | POA: Diagnosis not present

## 2023-05-20 DIAGNOSIS — R002 Palpitations: Secondary | ICD-10-CM

## 2023-05-20 NOTE — Patient Instructions (Signed)
Medication Instructions:   Your physician recommends that you continue on your current medications as directed. Please refer to the Current Medication list given to you today.  *If you need a refill on your cardiac medications before your next appointment, please call your pharmacy*    Testing/Procedures:  CARDIAC CALCIUM SCORE (SELF PAY)   Follow-Up: At [facility], you and your health needs are our priority.  As part of our continuing mission to provide you with exceptional heart care, we have created designated Provider Care Teams.  These Care Teams include your primary Cardiologist (physician) and Advanced Practice Providers (APPs -  Physician Assistants and Nurse Practitioners) who all work together to provide you with the care you need, when you need it.  We recommend signing up for the patient portal called "MyChart".  Sign up information is provided on this After Visit Summary.  MyChart is used to connect with patients for Virtual Visits (Telemedicine).  Patients are able to view lab/test results, encounter notes, upcoming appointments, etc.  Non-urgent messages can be sent to your provider as well.   To learn more about what you can do with MyChart, go to [email or web address].    Your next appointment:   1 year(s)  Provider:   Bridgette Christopher, MD      

## 2023-05-26 ENCOUNTER — Encounter: Payer: Self-pay | Admitting: Cardiology

## 2023-05-26 ENCOUNTER — Other Ambulatory Visit: Payer: Self-pay

## 2023-05-26 MED ORDER — ASPIRIN 81 MG PO TBEC
81.0000 mg | DELAYED_RELEASE_TABLET | Freq: Every day | ORAL | 12 refills | Status: AC
Start: 2023-05-26 — End: ?

## 2023-06-04 ENCOUNTER — Telehealth: Payer: Self-pay | Admitting: *Deleted

## 2023-06-04 ENCOUNTER — Ambulatory Visit (HOSPITAL_BASED_OUTPATIENT_CLINIC_OR_DEPARTMENT_OTHER)
Admission: RE | Admit: 2023-06-04 | Discharge: 2023-06-04 | Disposition: A | Discharge status: Home / Self Care | Payer: Self-pay | Source: Ambulatory Visit | Attending: Cardiology | Admitting: Cardiology

## 2023-06-04 DIAGNOSIS — I10 Essential (primary) hypertension: Secondary | ICD-10-CM | POA: Insufficient documentation

## 2023-06-04 DIAGNOSIS — I471 Supraventricular tachycardia, unspecified: Secondary | ICD-10-CM | POA: Insufficient documentation

## 2023-06-04 DIAGNOSIS — E782 Mixed hyperlipidemia: Secondary | ICD-10-CM | POA: Insufficient documentation

## 2023-06-04 DIAGNOSIS — R002 Palpitations: Secondary | ICD-10-CM | POA: Insufficient documentation

## 2023-06-04 DIAGNOSIS — E119 Type 2 diabetes mellitus without complications: Secondary | ICD-10-CM | POA: Insufficient documentation

## 2023-06-04 NOTE — Telephone Encounter (Signed)
The patient has been notified of the result and verbalized understanding.  All questions (if any) were answered.  Pt aware to start taking ASA 81 mg po daily.  Advised him to continue his lipitor regimen and always maintain good cholesterol to prevent progression of plaque in his heart arteries.  Advised him to maintain healthy lifestyle modification.  Pt verbalized understanding and agrees with this plan.

## 2023-06-04 NOTE — Telephone Encounter (Signed)
-----   Message from Meriam Sprague sent at 06/04/2023  1:31 PM EDT ----- Ca score is 615 (77% for age, gender, race matched controls). This means he has plaque present and the goal is to prevent this from progressing with time. His cholesterol is well controlled and therefore we do not need to adjust lipitor further. He should add ASA 81mg  daily.

## 2023-06-15 DIAGNOSIS — K5730 Diverticulosis of large intestine without perforation or abscess without bleeding: Secondary | ICD-10-CM | POA: Diagnosis not present

## 2023-06-15 DIAGNOSIS — D123 Benign neoplasm of transverse colon: Secondary | ICD-10-CM | POA: Diagnosis not present

## 2023-06-15 DIAGNOSIS — Z1211 Encounter for screening for malignant neoplasm of colon: Secondary | ICD-10-CM | POA: Diagnosis not present

## 2023-06-15 DIAGNOSIS — D122 Benign neoplasm of ascending colon: Secondary | ICD-10-CM | POA: Diagnosis not present

## 2023-06-17 DIAGNOSIS — D123 Benign neoplasm of transverse colon: Secondary | ICD-10-CM | POA: Diagnosis not present

## 2023-07-29 ENCOUNTER — Other Ambulatory Visit: Payer: Self-pay

## 2023-07-29 DIAGNOSIS — E119 Type 2 diabetes mellitus without complications: Secondary | ICD-10-CM

## 2023-07-29 DIAGNOSIS — Z79899 Other long term (current) drug therapy: Secondary | ICD-10-CM

## 2023-07-29 DIAGNOSIS — E7800 Pure hypercholesterolemia, unspecified: Secondary | ICD-10-CM

## 2023-07-29 DIAGNOSIS — E782 Mixed hyperlipidemia: Secondary | ICD-10-CM

## 2023-07-29 MED ORDER — ATORVASTATIN CALCIUM 80 MG PO TABS: 80.0000 mg | ORAL_TABLET | Freq: Every day | ORAL | 2 refills | Status: DC

## 2023-10-06 ENCOUNTER — Other Ambulatory Visit: Payer: Self-pay

## 2023-10-06 DIAGNOSIS — E7800 Pure hypercholesterolemia, unspecified: Secondary | ICD-10-CM

## 2023-10-06 DIAGNOSIS — E782 Mixed hyperlipidemia: Secondary | ICD-10-CM

## 2023-10-06 DIAGNOSIS — Z79899 Other long term (current) drug therapy: Secondary | ICD-10-CM

## 2023-10-06 MED ORDER — EZETIMIBE 10 MG PO TABS: 10.0000 mg | ORAL_TABLET | Freq: Every day | ORAL | 2 refills | Status: DC

## 2023-10-20 ENCOUNTER — Other Ambulatory Visit: Payer: Self-pay

## 2023-10-20 MED ORDER — METOPROLOL SUCCINATE ER 25 MG PO TB24: 25.0000 mg | ORAL_TABLET | Freq: Two times a day (BID) | ORAL | 2 refills | Status: DC

## 2024-01-01 DIAGNOSIS — R6889 Other general symptoms and signs: Secondary | ICD-10-CM | POA: Diagnosis not present

## 2024-01-01 DIAGNOSIS — J40 Bronchitis, not specified as acute or chronic: Secondary | ICD-10-CM | POA: Diagnosis not present

## 2024-01-28 DIAGNOSIS — E7800 Pure hypercholesterolemia, unspecified: Secondary | ICD-10-CM | POA: Diagnosis not present

## 2024-01-28 DIAGNOSIS — E119 Type 2 diabetes mellitus without complications: Secondary | ICD-10-CM | POA: Diagnosis not present

## 2024-01-28 DIAGNOSIS — Z125 Encounter for screening for malignant neoplasm of prostate: Secondary | ICD-10-CM | POA: Diagnosis not present

## 2024-01-28 DIAGNOSIS — Z139 Encounter for screening, unspecified: Secondary | ICD-10-CM | POA: Diagnosis not present

## 2024-01-28 DIAGNOSIS — Z23 Encounter for immunization: Secondary | ICD-10-CM | POA: Diagnosis not present

## 2024-01-28 DIAGNOSIS — I10 Essential (primary) hypertension: Secondary | ICD-10-CM | POA: Diagnosis not present

## 2024-01-28 DIAGNOSIS — F32A Depression, unspecified: Secondary | ICD-10-CM | POA: Diagnosis not present

## 2024-01-28 DIAGNOSIS — I471 Supraventricular tachycardia, unspecified: Secondary | ICD-10-CM | POA: Diagnosis not present

## 2024-04-18 IMAGING — DX DG CHEST 1V PORT
1 series · 1 of 1 positions shown · non-contrast
Comparison: April 01, 2022

CLINICAL DATA: Chest pain and shortness of breath.

EXAM:
PORTABLE CHEST 1 VIEW

[chest]
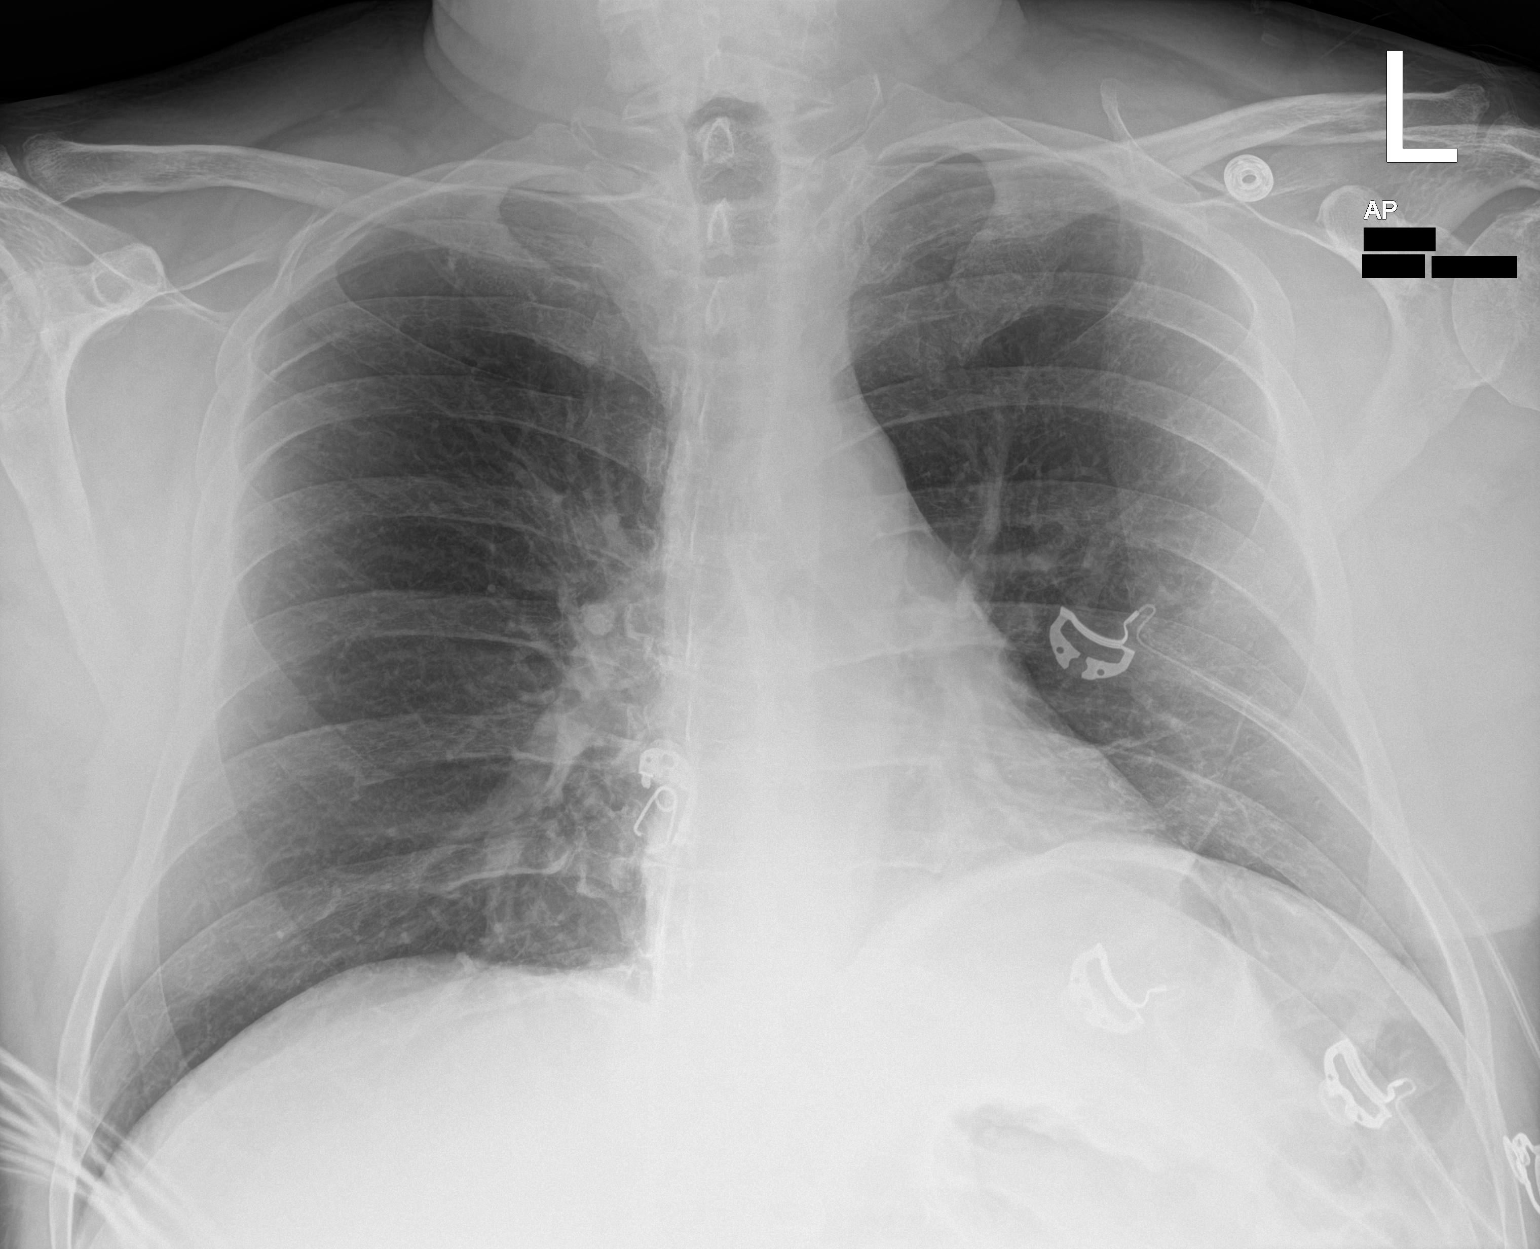

[1 of 1 positions shown; findings below may reference images not displayed]

FINDINGS: The heart size and mediastinal contours are within normal limits.
Mildly decreased lung volumes are seen. Both lungs are clear. The
visualized skeletal structures are unremarkable.
IMPRESSION: No active disease.

## 2024-04-27 ENCOUNTER — Other Ambulatory Visit: Payer: Self-pay | Admitting: Physician Assistant

## 2024-04-27 DIAGNOSIS — E7800 Pure hypercholesterolemia, unspecified: Secondary | ICD-10-CM

## 2024-04-27 DIAGNOSIS — E782 Mixed hyperlipidemia: Secondary | ICD-10-CM

## 2024-04-27 DIAGNOSIS — Z79899 Other long term (current) drug therapy: Secondary | ICD-10-CM

## 2024-04-27 DIAGNOSIS — E119 Type 2 diabetes mellitus without complications: Secondary | ICD-10-CM

## 2024-04-29 DIAGNOSIS — E7800 Pure hypercholesterolemia, unspecified: Secondary | ICD-10-CM | POA: Diagnosis not present

## 2024-04-29 DIAGNOSIS — E119 Type 2 diabetes mellitus without complications: Secondary | ICD-10-CM | POA: Diagnosis not present

## 2024-04-29 DIAGNOSIS — Z125 Encounter for screening for malignant neoplasm of prostate: Secondary | ICD-10-CM | POA: Diagnosis not present

## 2024-04-29 DIAGNOSIS — F32A Depression, unspecified: Secondary | ICD-10-CM | POA: Diagnosis not present

## 2024-04-29 DIAGNOSIS — I471 Supraventricular tachycardia, unspecified: Secondary | ICD-10-CM | POA: Diagnosis not present

## 2024-04-29 DIAGNOSIS — Z9181 History of falling: Secondary | ICD-10-CM | POA: Diagnosis not present

## 2024-04-29 DIAGNOSIS — I10 Essential (primary) hypertension: Secondary | ICD-10-CM | POA: Diagnosis not present

## 2024-05-30 ENCOUNTER — Other Ambulatory Visit: Payer: Self-pay | Admitting: Physician Assistant

## 2024-05-30 DIAGNOSIS — Z79899 Other long term (current) drug therapy: Secondary | ICD-10-CM

## 2024-05-30 DIAGNOSIS — E7800 Pure hypercholesterolemia, unspecified: Secondary | ICD-10-CM

## 2024-05-30 DIAGNOSIS — E119 Type 2 diabetes mellitus without complications: Secondary | ICD-10-CM

## 2024-05-30 DIAGNOSIS — E782 Mixed hyperlipidemia: Secondary | ICD-10-CM

## 2024-06-27 ENCOUNTER — Other Ambulatory Visit: Payer: Self-pay | Admitting: Physician Assistant

## 2024-06-27 DIAGNOSIS — E7800 Pure hypercholesterolemia, unspecified: Secondary | ICD-10-CM

## 2024-06-27 DIAGNOSIS — Z79899 Other long term (current) drug therapy: Secondary | ICD-10-CM

## 2024-06-27 DIAGNOSIS — E782 Mixed hyperlipidemia: Secondary | ICD-10-CM

## 2024-06-27 DIAGNOSIS — E119 Type 2 diabetes mellitus without complications: Secondary | ICD-10-CM

## 2024-07-03 ENCOUNTER — Other Ambulatory Visit: Payer: Self-pay | Admitting: Physician Assistant

## 2024-07-03 DIAGNOSIS — Z79899 Other long term (current) drug therapy: Secondary | ICD-10-CM

## 2024-07-03 DIAGNOSIS — E782 Mixed hyperlipidemia: Secondary | ICD-10-CM

## 2024-07-03 DIAGNOSIS — E7800 Pure hypercholesterolemia, unspecified: Secondary | ICD-10-CM

## 2024-08-01 ENCOUNTER — Other Ambulatory Visit: Payer: Self-pay | Admitting: Physician Assistant

## 2024-08-01 DIAGNOSIS — E7800 Pure hypercholesterolemia, unspecified: Secondary | ICD-10-CM

## 2024-08-01 DIAGNOSIS — Z79899 Other long term (current) drug therapy: Secondary | ICD-10-CM

## 2024-08-01 DIAGNOSIS — E782 Mixed hyperlipidemia: Secondary | ICD-10-CM

## 2024-08-04 ENCOUNTER — Other Ambulatory Visit: Payer: Self-pay | Admitting: Physician Assistant

## 2024-08-04 DIAGNOSIS — E119 Type 2 diabetes mellitus without complications: Secondary | ICD-10-CM

## 2024-08-04 DIAGNOSIS — E7800 Pure hypercholesterolemia, unspecified: Secondary | ICD-10-CM

## 2024-08-04 DIAGNOSIS — Z79899 Other long term (current) drug therapy: Secondary | ICD-10-CM

## 2024-08-04 DIAGNOSIS — E782 Mixed hyperlipidemia: Secondary | ICD-10-CM

## 2024-08-10 ENCOUNTER — Other Ambulatory Visit: Payer: Self-pay | Admitting: Cardiology

## 2024-08-12 DIAGNOSIS — R350 Frequency of micturition: Secondary | ICD-10-CM | POA: Diagnosis not present

## 2024-08-12 DIAGNOSIS — E119 Type 2 diabetes mellitus without complications: Secondary | ICD-10-CM | POA: Diagnosis not present

## 2024-08-12 DIAGNOSIS — E7800 Pure hypercholesterolemia, unspecified: Secondary | ICD-10-CM | POA: Diagnosis not present

## 2024-08-12 DIAGNOSIS — N390 Urinary tract infection, site not specified: Secondary | ICD-10-CM | POA: Diagnosis not present

## 2024-08-12 DIAGNOSIS — T753XXA Motion sickness, initial encounter: Secondary | ICD-10-CM | POA: Diagnosis not present

## 2024-08-12 DIAGNOSIS — I10 Essential (primary) hypertension: Secondary | ICD-10-CM | POA: Diagnosis not present

## 2024-08-12 DIAGNOSIS — F32A Depression, unspecified: Secondary | ICD-10-CM | POA: Diagnosis not present

## 2024-08-12 DIAGNOSIS — I471 Supraventricular tachycardia, unspecified: Secondary | ICD-10-CM | POA: Diagnosis not present

## 2024-08-16 ENCOUNTER — Telehealth: Payer: Self-pay | Admitting: Cardiology

## 2024-08-16 DIAGNOSIS — E782 Mixed hyperlipidemia: Secondary | ICD-10-CM

## 2024-08-16 DIAGNOSIS — E119 Type 2 diabetes mellitus without complications: Secondary | ICD-10-CM

## 2024-08-16 DIAGNOSIS — Z79899 Other long term (current) drug therapy: Secondary | ICD-10-CM

## 2024-08-16 DIAGNOSIS — E7800 Pure hypercholesterolemia, unspecified: Secondary | ICD-10-CM

## 2024-08-16 MED ORDER — ATORVASTATIN CALCIUM 80 MG PO TABS: 80.0000 mg | ORAL_TABLET | Freq: Every day | ORAL | 0 refills | Status: DC

## 2024-08-16 NOTE — Telephone Encounter (Signed)
*  STAT* If patient is at the pharmacy, call can be transferred to refill team.   1. Which medications need to be refilled? (please list name of each medication and dose if known)   atorvastatin  (LIPITOR) 80 MG tablet     4. Which pharmacy/location (including street and city if local pharmacy) is medication to be sent to? [facility] - [place] ([place] [place] - [address]    5. Do they need a 30 day or 90 day supply? 90  Scheduled with Dr. Lonni 12/19

## 2024-08-16 NOTE — Telephone Encounter (Signed)
 RX sent in

## 2024-08-16 NOTE — Telephone Encounter (Signed)
 Error

## 2024-08-23 ENCOUNTER — Other Ambulatory Visit: Payer: Self-pay

## 2024-08-23 DIAGNOSIS — Z79899 Other long term (current) drug therapy: Secondary | ICD-10-CM

## 2024-08-23 DIAGNOSIS — E7800 Pure hypercholesterolemia, unspecified: Secondary | ICD-10-CM

## 2024-08-23 DIAGNOSIS — E782 Mixed hyperlipidemia: Secondary | ICD-10-CM

## 2024-08-23 DIAGNOSIS — E119 Type 2 diabetes mellitus without complications: Secondary | ICD-10-CM

## 2024-08-23 MED ORDER — ATORVASTATIN CALCIUM 80 MG PO TABS: 80.0000 mg | ORAL_TABLET | Freq: Every day | ORAL | 0 refills | Status: AC

## 2024-08-23 NOTE — Telephone Encounter (Signed)
 Pt's medication was resent to pt's pharmacy, with enough medication to get pt to his appt in December 2025 as requested. Confirmation received.

## 2024-08-30 DIAGNOSIS — R42 Dizziness and giddiness: Secondary | ICD-10-CM | POA: Diagnosis not present

## 2024-08-30 DIAGNOSIS — R0981 Nasal congestion: Secondary | ICD-10-CM | POA: Diagnosis not present

## 2024-08-30 DIAGNOSIS — R6889 Other general symptoms and signs: Secondary | ICD-10-CM | POA: Diagnosis not present

## 2024-09-18 ENCOUNTER — Other Ambulatory Visit: Payer: Self-pay | Admitting: Cardiology

## 2024-10-25 DIAGNOSIS — H25013 Cortical age-related cataract, bilateral: Secondary | ICD-10-CM | POA: Diagnosis not present

## 2024-10-25 DIAGNOSIS — H2513 Age-related nuclear cataract, bilateral: Secondary | ICD-10-CM | POA: Diagnosis not present

## 2024-10-25 DIAGNOSIS — E119 Type 2 diabetes mellitus without complications: Secondary | ICD-10-CM | POA: Diagnosis not present

## 2024-10-28 DIAGNOSIS — Z23 Encounter for immunization: Secondary | ICD-10-CM | POA: Diagnosis not present

## 2024-11-11 ENCOUNTER — Encounter (HOSPITAL_BASED_OUTPATIENT_CLINIC_OR_DEPARTMENT_OTHER): Payer: Self-pay | Admitting: Cardiology

## 2024-11-11 ENCOUNTER — Ambulatory Visit (HOSPITAL_BASED_OUTPATIENT_CLINIC_OR_DEPARTMENT_OTHER): Admitting: Cardiology

## 2024-11-11 VITALS — BP 122/78 | HR 97 | Ht 74.0 in | Wt 231.5 lb

## 2024-11-11 DIAGNOSIS — I10 Essential (primary) hypertension: Secondary | ICD-10-CM | POA: Diagnosis not present

## 2024-11-11 DIAGNOSIS — I471 Supraventricular tachycardia, unspecified: Secondary | ICD-10-CM | POA: Diagnosis not present

## 2024-11-11 DIAGNOSIS — Z794 Long term (current) use of insulin: Secondary | ICD-10-CM

## 2024-11-11 DIAGNOSIS — I2510 Atherosclerotic heart disease of native coronary artery without angina pectoris: Secondary | ICD-10-CM

## 2024-11-11 DIAGNOSIS — E7800 Pure hypercholesterolemia, unspecified: Secondary | ICD-10-CM | POA: Diagnosis not present

## 2024-11-11 DIAGNOSIS — E1140 Type 2 diabetes mellitus with diabetic neuropathy, unspecified: Secondary | ICD-10-CM

## 2024-11-11 NOTE — Patient Instructions (Signed)
 Medication Instructions:  No changes *If you need a refill on your cardiac medications before your next appointment, please call your pharmacy*  Lab Work: none If you have labs (blood work) drawn today and your tests are completely normal, you will receive your results only by: MyChart Message (if you have MyChart) OR A paper copy in the mail If you have any lab test that is abnormal or we need to change your treatment, we will call you to review the results.  Testing/Procedures: none  Follow-Up: At [facility], you and your health needs are our priority.  As part of our continuing mission to provide you with exceptional heart care, our providers are all part of one team.  This team includes your primary Cardiologist (physician) and Advanced Practice Providers or APPs (Physician Assistants and Nurse Practitioners) who all work together to provide you with the care you need, when you need it.  Your next appointment:   12 month(s)  Provider:   Shelda Bruckner, MD, Rosaline Bane, NP, or Reche Finder, NP

## 2024-11-11 NOTE — Progress Notes (Signed)
 " Cardiology Office Note:  .   Date:  11/11/2024  ID:  Peter Thornton, DOB [birth year], MRN [id number] PCP: Montey Lot, PA-C  [facility] Providers Cardiologist:  Shelda Bruckner, MD {  History of Present Illness: .   Peter Thornton is a 68 y.o. male with PMH pSVT, type II diabetes on insulin , hypertension, hypercholesterolemia, coronary artery calcification. He was previously followed by Dr. Hobart and established care with me on 11/11/24.  Today: Has felt dizzy with standing rapidly. Happens mostly in the morning, 3-4 times/day. No clear triggers, no alleviating factors. Better as the day goes on. No syncope. Manages with stopping for a minute, then returns to activity. No sensation of motion, more of a lightheaded feeling. Does get motion sick easily, not like that sensation. Drinks a lot of water. Doesn't check blood pressure at home. No palpitations, no chest pain with this. Discussed diabetes, has some neuropathy in his feet, discussed that this can be autonomic neuropathy. Has issues with erectile dysfunction as well. Has tried medications and injections without improvement.  Has had no SVT symptoms in a long time.  ROS: Denies chest pain, shortness of breath at rest or with normal exertion. No PND, orthopnea, LE edema or unexpected weight gain. No syncope or palpitations. ROS otherwise negative except as noted.   Studies Reviewed: SABRA    EKG:  EKG Interpretation Date/Time:  Friday November 11 2024 09:38:01 EST Ventricular Rate:  98 PR Interval:  200 QRS Duration:  88 QT Interval:  346 QTC Calculation: 441 R Axis:   13  Text Interpretation: Normal sinus rhythm Normal ECG Confirmed by Bruckner Shelda [id number]) on 11/11/2024 10:09:45 AM    Physical Exam:   VS:  BP 122/78 (BP Location: Right Arm, Patient Position: Sitting, Cuff Size: Normal)   Pulse 97   Ht 6' 2 (1.88 m)   Wt 231 lb 8 oz (105 kg)   SpO2 96%   BMI 29.72 kg/m    Wt  Readings from Last 3 Encounters:  11/11/24 231 lb 8 oz (105 kg)  05/20/23 230 lb (104.3 kg)  10/31/22 234 lb (106.1 kg)    GEN: Well nourished, well developed in no acute distress HEENT: Normal, moist mucous membranes NECK: No JVD CARDIAC: regular rhythm, normal S1 and S2, no rubs or gallops. No murmur. VASCULAR: Radial and DP pulses 2+ bilaterally. No carotid bruits RESPIRATORY:  Clear to auscultation without rales, wheezing or rhonchi  ABDOMEN: Soft, non-tender, non-distended MUSCULOSKELETAL:  Ambulates independently SKIN: Warm and dry, no edema NEUROLOGIC:  Alert and oriented x 3. No focal neuro deficits noted. PSYCHIATRIC:  Normal affect    ASSESSMENT AND PLAN: .    Paroxysmal SVT  -has been asymptomatic -well controlled on metoprolol , continue  Hypertension -well controlled today. Did discuss watching for low BP, following symptoms for orthostasis -continue lisinopril  Coronary artery calcification, consistent with nonobstructive CAD Hypercholesterolemia -Ca score 615 -LDL goal <55, last KPN 04/29/24 with LDL 51, TG 85, HDL 30, Tchol 98 -on aspirin , atorvastatin  and ezetimibe  -reviewed red flag warning signs that need immediate medical attention  Type II diabetes, on insulin , with peripheral neuropathy -has had diabetes for ~30 years -suspect he may have intermittent autonomic symptoms/orthostasis based on description -discussed hydration, compression stockings, slow position changes  CV risk counseling and prevention -recommend heart healthy/Mediterranean diet, with whole grains, fruits, vegetable, fish, lean meats, nuts, and olive oil. Limit salt. -recommend moderate walking, 3-5 times/week for 30-50 minutes each session. Aim for at  least 150 minutes/week. Goal should be pace of 3 miles/hours, or walking 1.5 miles in 30 minutes -recommend avoidance of tobacco products. Avoid excess alcohol.  Dispo: 1 year or sooner as needed  Signed, Shelda Bruckner, MD    Shelda Bruckner, MD, PhD, [facility]  [facility]  [place]  [facility] at [facility] [address], [address] [place], [place] [address] [phone]   "
